# Patient Record
Sex: Male | Born: 1965 | Race: White | Hispanic: No | Marital: Single | Smoking: Never smoker
Health system: Southern US, Community
[De-identification: ages and names within clinical notes are randomized; demographics above are authoritative.]

## PROBLEM LIST (undated history)

## (undated) DIAGNOSIS — J45909 Unspecified asthma, uncomplicated: Secondary | ICD-10-CM

## (undated) DIAGNOSIS — Z9889 Other specified postprocedural states: Secondary | ICD-10-CM

## (undated) DIAGNOSIS — Z8719 Personal history of other diseases of the digestive system: Secondary | ICD-10-CM

## (undated) DIAGNOSIS — R112 Nausea with vomiting, unspecified: Secondary | ICD-10-CM

## (undated) HISTORY — PX: SHOULDER SURGERY: SHX246

## (undated) HISTORY — PX: KNEE ARTHROSCOPY: SUR90

## (undated) HISTORY — DX: Unspecified asthma, uncomplicated: J45.909

## (undated) HISTORY — PX: APPENDECTOMY: SHX54

## (undated) HISTORY — PX: BACK SURGERY: SHX140

---

## 2006-07-25 ENCOUNTER — Ambulatory Visit: Payer: Self-pay | Admitting: Pain Medicine

## 2006-09-07 ENCOUNTER — Ambulatory Visit: Payer: Self-pay | Admitting: Pain Medicine

## 2006-11-21 ENCOUNTER — Ambulatory Visit: Payer: Self-pay | Admitting: Pain Medicine

## 2007-01-10 ENCOUNTER — Ambulatory Visit: Payer: Self-pay | Admitting: Physician Assistant

## 2007-01-17 ENCOUNTER — Ambulatory Visit: Payer: Self-pay | Admitting: Pain Medicine

## 2007-01-23 ENCOUNTER — Ambulatory Visit: Payer: Self-pay | Admitting: Pain Medicine

## 2007-02-06 ENCOUNTER — Ambulatory Visit: Payer: Self-pay | Admitting: Pain Medicine

## 2007-04-04 ENCOUNTER — Ambulatory Visit: Payer: Self-pay | Admitting: Physician Assistant

## 2007-04-11 ENCOUNTER — Ambulatory Visit: Payer: Self-pay | Admitting: Pain Medicine

## 2007-04-24 ENCOUNTER — Ambulatory Visit: Payer: Self-pay | Admitting: Pain Medicine

## 2007-05-24 ENCOUNTER — Ambulatory Visit: Payer: Self-pay | Admitting: Physician Assistant

## 2007-06-26 ENCOUNTER — Ambulatory Visit: Payer: Self-pay | Admitting: Physician Assistant

## 2007-08-07 ENCOUNTER — Ambulatory Visit: Payer: Self-pay | Admitting: Pain Medicine

## 2007-10-12 ENCOUNTER — Encounter: Payer: Self-pay | Admitting: Pain Medicine

## 2007-10-25 ENCOUNTER — Ambulatory Visit: Payer: Self-pay | Admitting: Pain Medicine

## 2008-09-03 ENCOUNTER — Ambulatory Visit: Payer: Self-pay | Admitting: Pain Medicine

## 2016-02-04 ENCOUNTER — Encounter: Payer: Self-pay | Admitting: Pain Medicine

## 2016-02-04 ENCOUNTER — Ambulatory Visit: Payer: Worker's Compensation | Attending: Pain Medicine | Admitting: Pain Medicine

## 2016-02-04 VITALS — BP 135/79 | HR 68 | Temp 99.0°F | Resp 16 | Ht 70.0 in | Wt 197.0 lb

## 2016-02-04 DIAGNOSIS — Z0189 Encounter for other specified special examinations: Secondary | ICD-10-CM | POA: Diagnosis not present

## 2016-02-04 DIAGNOSIS — M79606 Pain in leg, unspecified: Secondary | ICD-10-CM | POA: Diagnosis present

## 2016-02-04 DIAGNOSIS — Z4542 Encounter for adjustment and management of neuropacemaker (brain) (peripheral nerve) (spinal cord): Secondary | ICD-10-CM

## 2016-02-04 DIAGNOSIS — Z462 Encounter for fitting and adjustment of other devices related to nervous system and special senses: Secondary | ICD-10-CM | POA: Diagnosis not present

## 2016-02-04 DIAGNOSIS — Z9689 Presence of other specified functional implants: Secondary | ICD-10-CM | POA: Diagnosis not present

## 2016-02-04 DIAGNOSIS — M549 Dorsalgia, unspecified: Secondary | ICD-10-CM | POA: Insufficient documentation

## 2016-02-04 DIAGNOSIS — J45909 Unspecified asthma, uncomplicated: Secondary | ICD-10-CM | POA: Insufficient documentation

## 2016-02-04 DIAGNOSIS — G8929 Other chronic pain: Secondary | ICD-10-CM | POA: Diagnosis not present

## 2016-02-04 NOTE — Progress Notes (Signed)
Patient's Name: Joseph Ware  Patient type: New patient  MRN: [id number]  Service setting: Ambulatory outpatient  DOB: 01-26-1966  Location: [facility]  DOS: 02/04/2016  Primary Care Physician: No primary care provider on file.  Note by: Edyth Glomb A. Dossie Arbour, M.D, DABA, DABAPM, DABPM, DABIPP, FIPP  Referring Physician: No ref. provider found  Specialty: Board-Certified Interventional Pain Management  Worker's Compensation case    Primary Reason(s) for Visit: Initial Patient Evaluation CC: Back Pain and Leg Pain   HPI  Joseph Ware is a 50 y.o. year old, male patient, who comes today for an initial evaluation. He has Spinal cord stimulator status; Battery end of life of spinal cord stimulator; Chronic pain; and Encounter for pain management planning on his problem list.. His primarily concern today is the Back Pain and Leg Pain   Pain Assessment: Self-Reported Pain Score: 5  Reported level of pain is compatible with clinical observations Pain Type: Chronic pain Pain Location: Back Pain Orientation: Left, Right Pain Descriptors / Indicators: Constant, Sharp Pain Frequency: Constant  Onset and Duration: Sudden, Started with work-related injury, Date of injury: 02/23/2005 and Present longer than 3 months Cause of pain: Worker's Compensation Injury. This was secondary to a work-related accident. Severity: No change since onset and NAS-11 now: 5/10 Timing: Not influenced by the time of the day Aggravating Factors: Bending, Lifiting, Prolonged sitting, Prolonged standing, Twisting and Walking Alleviating Factors: No alleviating factors except for spinal cord stimulator. Associated Problems: Numbness, Tingling and Pain that wakes patient up Quality of Pain: Constant, Exhausting, Nagging, Sharp, Shooting, Throbbing, Tingling and Tiring Previous Examinations or Tests: MRI scan, Nerve block, X-rays, Neurosurgical evaluation, Orthoperdic evaluation and Psychiatric  evaluation Previous Treatments: Spinal cord stimulator  The patient comes into the clinics today for the first time for a chronic pain management evaluation. I initially met the patient around 2008 at which time we implanted a spinal cord stimulator into the thoracolumbar region. The patient returns to the clinics today due to the fact that the battery on the device seems to now be dead. This means that the battery that he has implanted lasted 19 years. He would like to have it replaced.  The patient indicates his pain to be located in the center of the lower back radiating towards the left leg through the posterior aspect of the leg all the way down into the bottom of his left foot. This is usually well controlled with his spinal cord stimulator which he describes as a "blessing".   Historic Controlled Substance Pharmacotherapy Review  Currently Prescribed Analgesic: The patient is currently not using any type of pain medication. MME/day: 0 mg/day Pharmacodynamics: N/A Historical Background Evaluation: N/A Risk Assessment: Substance Use Disorder (SUD) Risk Level: Low Opioid Risk Tool (ORT) Score: Total Score: 0 Low Risk for SUD (Score <3) Depression Scale Score: PHQ-2: PHQ-2 Total Score: 0 No depression (0) PHQ-9: PHQ-9 Total Score: 0 No depression (0-4)  Pharmacologic Plan: No changes. We do not plan to start the patient on any pain medication at this time.  Meds  The patient has a current medication list which includes the following prescription(s): albuterol sulfate and ibuprofen.  ROS  Cardiovascular History: Negative for hypertension, coronary artery diseas, myocardial infraction, anticoagulant therapy or heart failure Pulmonary or Respiratory History: Asthma Neurological History: Negative for epilepsy, stroke, urinary or fecal inontinence, spina bifida or tethered cord syndrome Review of Past Neurological Studies: No results found for this or any previous  visit. Psychological-Psychiatric History: Negative for  anxiety, depression, schizophrenia, bipolar disorders or suicidal ideations or attempts Gastrointestinal History: Negative for peptic ulcer disease, hiatal hernia, GERD, IBS, hepatitis, cirrhosis or pancreatitis Genitourinary History: Negative for nephrolithiasis, hematuria, renal failure or chronic kidney disease Hematological History: Negative for anticoagulant therapy, anemia, bruising or bleeding easily, hemophilia, sickle cell disease or trait, thrombocytopenia or coagulupathies Endocrine History: Negative for diabetes or thyroid disease Rheumatologic History: Negative for lupus, osteoarthritis, rheumatoid arthritis, myositis, polymyositis or fibromyagia Musculoskeletal History: Negative for myasthenia gravis, muscular dystrophy, multiple sclerosis or malignant hyperthermia Work History: Legally disabled since 2006 according to the patient.  Allergies  Joseph Ware has No Known Allergies.  Tony  Medical:  Joseph Ware  has a past medical history of Asthma. Family: family history is not on file. Surgical:  has past surgical history that includes Shoulder surgery (Right). Tobacco:  reports that he has never smoked. He does not have any smokeless tobacco history on file. Alcohol:  reports that he drinks about 1.8 oz of alcohol per week. Drug:  reports that he does not use illicit drugs. Active Ambulatory Problems    Diagnosis Date Noted  . Spinal cord stimulator status 02/04/2016  . Battery end of life of spinal cord stimulator 02/04/2016  . Chronic pain 02/04/2016  . Encounter for pain management planning 02/04/2016   Resolved Ambulatory Problems    Diagnosis Date Noted  . No Resolved Ambulatory Problems   Past Medical History  Diagnosis Date  . Asthma     Constitutional Exam  Vitals: Blood pressure 135/79, pulse 68, temperature 99 F (37.2 C), temperature source Oral, resp. rate 16, height 5' 10"  (1.778 m), weight 197 lb  (89.359 kg), SpO2 99 %. General appearance: Well nourished, well developed, and well hydrated. In no acute distress Calculated BMI/Body habitus: Body mass index is 28.27 kg/(m^2). (25-29.9 kg/m2) Overweight - 20% higher incidence of chronic pain Psych/Mental status: Alert and oriented x 3 (person, place, & time) Eyes: PERLA Respiratory: No evidence of acute respiratory distress  Cervical Spine Exam  Inspection: No masses, redness, or swelling Alignment: Symmetrical ROM: Functional: Adequate ROM Active: Unrestricted ROM Stability: No instability detected Muscle strength & Tone: Functionally intact Sensory: Unimpaired Palpation: No complaints of tenderness  Upper Extremity (UE) Exam    Side: Right upper extremity  Side: Left upper extremity  Inspection: No masses, redness, swelling, or asymmetry  Inspection: No masses, redness, swelling, or asymmetry  ROM:  ROM:  Functional: Adequate ROM  Functional: Adequate ROM  Active: Unrestricted ROM  Active: Unrestricted ROM  Muscle strength & Tone: Functionally intact  Muscle strength & Tone: Functionally intact  Sensory: Unimpaired  Sensory: Unimpaired  Palpation: Non-contributory  Palpation: Non-contributory   Thoracic Spine Exam  Inspection: No masses, redness, or swelling Alignment: Symmetrical ROM: Functional: Adequate ROM Active: Unrestricted ROM Stability: No instability detected Sensory: Unimpaired Muscle strength & Tone: Functionally intact Palpation: No complaints of tenderness  Lumbar Spine Exam  Inspection: No masses, redness, or swelling. Well-healed scar. Alignment: Symmetrical ROM: Functional: Adequate ROM Active: Limited ROM Stability: No instability detected Muscle strength & Tone: Functionally intact Sensory: Unimpaired Palpation: Tender Provocative Tests: Lumbar Hyperextension and rotation test: deferred Patrick's Maneuver: deferred  Gait & Posture Assessment  Ambulation: Unassisted Gait:  Unaffected Posture: WNL  Lower Extremity Exam    Side: Right lower extremity  Side: Left lower extremity  Inspection: No masses, redness, swelling, or asymmetry ROM:  Inspection: No masses, redness, swelling, or asymmetry ROM:  Functional: Adequate ROM  Functional: Adequate ROM  Active: Unrestricted  ROM  Active: Unrestricted ROM  Muscle strength & Tone: Functionally intact  Muscle strength & Tone: Functionally intact  Sensory: Unimpaired  Sensory: Unimpaired  Palpation: Non-contributory  Palpation: Non-contributory   Assessment  Primary Diagnosis & Pertinent Problem List: The primary encounter diagnosis was Spinal cord stimulator status. Diagnoses of Battery end of life of spinal cord stimulator, Chronic pain, and Encounter for pain management planning were also pertinent to this visit.  Visit Diagnosis: 1. Spinal cord stimulator status   2. Battery end of life of spinal cord stimulator   3. Chronic pain   4. Encounter for pain management planning     Assessment: No problem-specific assessment & plan notes found for this encounter.   Plan of Care  Initial Treatment Plan:  Please be advised that as per protocol, today's visit has been an evaluation only. We have not taken over the patient's controlled substance management.  Problem List Items Addressed This Visit      High   Battery end of life of spinal cord stimulator   Chronic pain (Chronic)   Relevant Medications   ibuprofen (ADVIL,MOTRIN) 200 MG tablet   Other Relevant Orders   Comprehensive metabolic panel   C-reactive protein   Magnesium   Sedimentation rate   Vitamin B12   25-Hydroxyvitamin D Lcms D2+D3   CBC with Differential/Platelet   Encounter for pain management planning (Chronic)   Spinal cord stimulator status - Primary (Chronic)   Relevant Orders   DG Thoracic Spine 2 View   DG Lumbar Spine Complete      Pharmacotherapy (Medications Ordered): No orders of the defined types were placed in this  encounter.    Lab-work & Procedure Ordered: Orders Placed This Encounter  Procedures  . DG Thoracic Spine 2 View  . DG Lumbar Spine Complete  . Comprehensive metabolic panel  . C-reactive protein  . Magnesium  . Sedimentation rate  . Vitamin B12  . 25-Hydroxyvitamin D Lcms D2+D3  . CBC with Differential/Platelet    Imaging Ordered: DG THORACIC SPINE 2 VIEW DG LUMBAR SPINE COMPLETE 4 +V  Interventional Therapies: Scheduled:  None at this time.    Considering:  Spinal cord stimulator battery revision and replacement due to end of battery life.    PRN Procedures:  Spinal cord stimulator battery revision and replacement due to end of battery life.    Referral(s) or Consult(s): None at this time.  Medications administered during this visit: Mr. Kuang does not currently have medications on file.  Prescriptions ordered during this visit: New Prescriptions   No medications on file    Requested PM Follow-up: Return for Return after ordered test(s)..  No future appointments.   Primary Care Physician: No primary care provider on file. Location: [facility] Note by: Raylie Maddison A. Dossie Arbour, M.D, DABA, DABAPM, DABPM, DABIPP, FIPP  Pain Score Disclaimer: We use the NRS-11 scale. This is a self-reported, subjective measurement of pain severity with only modest accuracy. It is used primarily to identify changes within a particular patient. It must be understood that outpatient pain scales are significantly less accurate that those used for research, where they can be applied under ideal controlled circumstances with minimal exposure to variables. In reality, the score is likely to be a combination of pain intensity and pain affect, where pain affect describes the degree of emotional arousal or changes in action readiness caused by the sensory experience of pain. Factors such as social and work situation, setting, emotional state, anxiety levels,  expectation, and prior pain experience may influence pain perception and show large inter-individual differences that may also be affected by time variables.  Patient instructions provided during this appointment: There are no Patient Instructions on file for this visit.

## 2016-02-04 NOTE — Progress Notes (Signed)
Safety precautions to be maintained throughout the outpatient stay will include: orient to surroundings, keep bed in low position, maintain call bell within reach at all times, provide assistance with transfer out of bed and ambulation.  

## 2016-02-25 ENCOUNTER — Ambulatory Visit
Admission: RE | Admit: 2016-02-25 | Discharge: 2016-02-25 | Disposition: A | Discharge status: Home / Self Care | Payer: Worker's Compensation | Source: Ambulatory Visit | Attending: Pain Medicine | Admitting: Pain Medicine

## 2016-02-25 ENCOUNTER — Other Ambulatory Visit
Admission: RE | Admit: 2016-02-25 | Discharge: 2016-02-25 | Disposition: A | Discharge status: Home / Self Care | Payer: Worker's Compensation | Source: Ambulatory Visit | Attending: Pain Medicine | Admitting: Pain Medicine

## 2016-02-25 DIAGNOSIS — Z9689 Presence of other specified functional implants: Secondary | ICD-10-CM

## 2016-02-25 DIAGNOSIS — G8929 Other chronic pain: Secondary | ICD-10-CM | POA: Diagnosis present

## 2016-02-25 DIAGNOSIS — M47814 Spondylosis without myelopathy or radiculopathy, thoracic region: Secondary | ICD-10-CM | POA: Insufficient documentation

## 2016-02-25 DIAGNOSIS — M2578 Osteophyte, vertebrae: Secondary | ICD-10-CM | POA: Insufficient documentation

## 2016-02-25 DIAGNOSIS — M546 Pain in thoracic spine: Secondary | ICD-10-CM | POA: Diagnosis present

## 2016-02-25 LAB — CBC WITH DIFFERENTIAL/PLATELET
BASOS ABS: 0 10*3/uL (ref 0–0.1)
BASOS PCT: 0 %
EOS PCT: 4 %
Eosinophils Absolute: 0.2 10*3/uL (ref 0–0.7)
HEMATOCRIT: 45.4 % (ref 40.0–52.0)
Hemoglobin: 15.4 g/dL (ref 13.0–18.0)
LYMPHS PCT: 20 %
Lymphs Abs: 0.8 10*3/uL — ABNORMAL LOW (ref 1.0–3.6)
MCH: 32.6 pg (ref 26.0–34.0)
MCHC: 33.9 g/dL (ref 32.0–36.0)
MCV: 96.2 fL (ref 80.0–100.0)
MONO ABS: 0.3 10*3/uL (ref 0.2–1.0)
Monocytes Relative: 8 %
NEUTROS ABS: 2.9 10*3/uL (ref 1.4–6.5)
Neutrophils Relative %: 68 %
PLATELETS: 174 10*3/uL (ref 150–440)
RBC: 4.72 MIL/uL (ref 4.40–5.90)
RDW: 13 % (ref 11.5–14.5)
WBC: 4.3 10*3/uL (ref 3.8–10.6)

## 2016-02-25 LAB — COMPREHENSIVE METABOLIC PANEL
ALBUMIN: 4.8 g/dL (ref 3.5–5.0)
ALK PHOS: 74 U/L (ref 38–126)
ALT: 36 U/L (ref 17–63)
AST: 32 U/L (ref 15–41)
Anion gap: 6 (ref 5–15)
BILIRUBIN TOTAL: 0.8 mg/dL (ref 0.3–1.2)
BUN: 18 mg/dL (ref 6–20)
CALCIUM: 9.3 mg/dL (ref 8.9–10.3)
CO2: 25 mmol/L (ref 22–32)
Chloride: 107 mmol/L (ref 101–111)
Creatinine, Ser: 1.38 mg/dL — ABNORMAL HIGH (ref 0.61–1.24)
GFR calc Af Amer: >60 mL/min (ref >60)
GFR calc non Af Amer: 59 mL/min — ABNORMAL LOW (ref >60)
GLUCOSE: 106 mg/dL — AB (ref 65–99)
Potassium: 4.3 mmol/L (ref 3.5–5.1)
Sodium: 138 mmol/L (ref 135–145)
TOTAL PROTEIN: 7.2 g/dL (ref 6.5–8.1)

## 2016-02-25 LAB — VITAMIN B12: VITAMIN B 12: 235 pg/mL (ref 180–914)

## 2016-02-25 LAB — SEDIMENTATION RATE: SED RATE: 2 mm/h (ref 0–15)

## 2016-02-25 LAB — MAGNESIUM: Magnesium: 2.1 mg/dL (ref 1.7–2.4)

## 2016-02-26 LAB — C-REACTIVE PROTEIN

## 2016-02-26 NOTE — Progress Notes (Signed)
Quick Note:   Normal fasting (NPO x 8 hours) glucose levels are between 65-99 mg/dl, with 2 hour fasting, levels are usually less than 140 mg/dl. Any random blood glucose level greater than 200 mg/dl is considered to be Diabetes.  Normal Creatinine levels are between 0.5 and 0.9 mg/dl for our lab. Any condition that impairs the function of the kidneys is likely to raise the creatinine level in the blood. The most common causes of longstanding (chronic) kidney disease in adults are high blood pressure and diabetes. Other causes of elevated blood creatinine levels include drugs, ingestion of a large amount of dietary meat, kidney infections, rhabdomyolysis (abnormal muscle breakdown), and urinary tract obstruction.  eGFR (Estimated Glomerular Filtration Rate) results are reported as milliliters/minute/1.73m2 (mL/min/1.73m2). Because some laboratories do not collect information on a patient's race when the sample is collected for testing, they may report calculated results for both African Americans and non-African Americans.  The National Kid[employer] (NKF) suggests only reporting actual results once values are < 60 mL/min. 1. Normal values: 90-120 mL/min 2. Below 60 mL/min suggests that some kidney damage has occurred. 3. Between 59 and 30 indicate (Moderate) Stage 3 kidney disease. 4. Between 29 and 15 represent (Severe) Stage 4 kidney disease. 5. Less than 15 is considered (Kidney Failure) Stage 5. ______ 

## 2016-02-29 LAB — 25-HYDROXY VITAMIN D LCMS D2+D3
25-Hydroxy, Vitamin D-2: <1 ng/mL
25-Hydroxy, Vitamin D: 34 ng/mL

## 2016-02-29 LAB — 25-HYDROXYVITAMIN D LCMS D2+D3: 25-HYDROXY, VITAMIN D-3: 34 ng/mL

## 2016-04-07 ENCOUNTER — Ambulatory Visit: Payer: Worker's Compensation | Attending: Pain Medicine | Admitting: Pain Medicine

## 2016-04-07 ENCOUNTER — Encounter: Payer: Self-pay | Admitting: Pain Medicine

## 2016-04-07 ENCOUNTER — Encounter (INDEPENDENT_AMBULATORY_CARE_PROVIDER_SITE_OTHER): Payer: Self-pay

## 2016-04-07 VITALS — BP 126/88 | HR 70 | Temp 98.2°F | Resp 16 | Ht 70.0 in | Wt 200.0 lb

## 2016-04-07 DIAGNOSIS — M47816 Spondylosis without myelopathy or radiculopathy, lumbar region: Secondary | ICD-10-CM | POA: Diagnosis not present

## 2016-04-07 DIAGNOSIS — Z462 Encounter for fitting and adjustment of other devices related to nervous system and special senses: Secondary | ICD-10-CM

## 2016-04-07 DIAGNOSIS — Z4542 Encounter for adjustment and management of neuropacemaker (brain) (peripheral nerve) (spinal cord): Secondary | ICD-10-CM

## 2016-04-07 DIAGNOSIS — M549 Dorsalgia, unspecified: Secondary | ICD-10-CM | POA: Diagnosis present

## 2016-04-07 DIAGNOSIS — J45909 Unspecified asthma, uncomplicated: Secondary | ICD-10-CM | POA: Insufficient documentation

## 2016-04-07 DIAGNOSIS — M545 Low back pain: Secondary | ICD-10-CM | POA: Diagnosis not present

## 2016-04-07 DIAGNOSIS — Z9689 Presence of other specified functional implants: Secondary | ICD-10-CM

## 2016-04-07 DIAGNOSIS — M2578 Osteophyte, vertebrae: Secondary | ICD-10-CM | POA: Insufficient documentation

## 2016-04-07 DIAGNOSIS — G8929 Other chronic pain: Secondary | ICD-10-CM | POA: Diagnosis not present

## 2016-04-07 DIAGNOSIS — M79606 Pain in leg, unspecified: Secondary | ICD-10-CM | POA: Diagnosis present

## 2016-04-07 DIAGNOSIS — M25519 Pain in unspecified shoulder: Secondary | ICD-10-CM | POA: Insufficient documentation

## 2016-04-07 DIAGNOSIS — M5136 Other intervertebral disc degeneration, lumbar region: Secondary | ICD-10-CM | POA: Diagnosis not present

## 2016-04-07 NOTE — Progress Notes (Signed)
t's Name: Joseph Ware  Patient type: Established  MRN: [id number]  Service setting: Ambulatory outpatient  DOB: 01/11/1966  Location: [facility]  DOS: 04/07/2016  Primary Care Physician: No PCP Per Patient  Note by: Beatriz Chancellor A. Dossie Arbour, M.D, DABA, DABAPM, DABPM, DABIPP, FIPP  Referring Physician: No ref. provider found  Specialty: Board-Certified Interventional Pain Management  Last Visit to Pain Management: 02/04/2016   Primary Reason(s) for Visit: Encounter for Pre-op evaluation (Level of risk: moderate) CC: Shoulder Pain; Leg Pain; and Back Pain   HPI  Joseph Ware is a 50 y.o. year old, male patient, who returns today as an established patient. He has Spinal cord stimulator status; Battery end of life of spinal cord stimulator; Chronic pain; and Encounter for pain management planning on his problem list.. His primarily concern today is the Shoulder Pain; Leg Pain; and Back Pain   Pain Assessment: Self-Reported Pain Score: 7  Clinically the patient looks like a 2/10 Reported level is inconsistent with clinical obrservations Information on the proper use of the pain score provided to the patient today. Pain Type: Chronic pain Pain Location: Shoulder Pain Orientation: Left, Right Pain Descriptors / Indicators: Sharp Pain Frequency: Constant  The patient comes into the clinics today for pre-op evaluation prior to his spinal cord stimulator revision and replacement. The patient was met today by the [employer] representative and extensive information was provided to him with regards to the device and the options. The patient has an older device where the leads are not MRI compatible and because of this he would like to have some new leads implanted. He indicates that he may be needing some MRIs of his shoulders and he would like not to be limited by the implant. Patient informed of choices and wants complete replacement of generator and leads. The patient was also  informed today of the risks and possible complications associated with the procedure including, but not limited to: bleeding, infection, nerve damage, mechanical malfunction, and failure to provide relief of the pain.  Date of Last Visit: 02/04/16 Service Provided on Last Visit: Evaluation  Controlled Substance Pharmacotherapy Assessment & REMS (Risk Evaluation and Mitigation Strategy)  Currently Prescribed Analgesic: The patient is currently not using any type of pain medication. MME/day: 0 mg/day Pharmacodynamics: N/A Historical Background Evaluation: N/A Risk Assessment: Aberrant Behavior: None observed today Substance Use Disorder (SUD) Risk Level: Low Risk of opioid abuse or dependence: 0.7-3.0% with doses ? 36 MME/day and 6.1-26% with doses ? 120 MME/day. Opioid Risk Tool (ORT) Score: Total Score: 0 Low Risk for SUD (Score <3) Depression Scale Score: PHQ-2: PHQ-2 Total Score: 0 No depression (0) PHQ-9: PHQ-9 Total Score: 0 No depression (0-4)  Pharmacologic Plan: Today we may be taking over the patient's pharmacological regimen. See below  Previous Illicit Drug Screen Labs(s): No results found for: MDMA, COCAINSCRNUR, PCPSCRNUR, [facility]  Laboratory Chemistry  Inflammation Markers Lab Results  Component Value Date   ESRSEDRATE 2 02/25/2016   CRP <0.5 02/25/2016    Renal Function Lab Results  Component Value Date   BUN 18 02/25/2016   CREATININE 1.38* 02/25/2016   GFRAA >60 02/25/2016   GFRNONAA 59* 02/25/2016    Hepatic Function Lab Results  Component Value Date   AST 32 02/25/2016   ALT 36 02/25/2016   ALBUMIN 4.8 02/25/2016    Electrolytes Lab Results  Component Value Date   NA 138 02/25/2016   K 4.3 02/25/2016   CL 107 02/25/2016   CALCIUM 9.3 02/25/2016  MG 2.1 02/25/2016    Pain Modulating Vitamins Lab Results  Component Value Date   25OHVITD1 34 02/25/2016   25OHVITD2 <1.0 02/25/2016   25OHVITD3 34 02/25/2016   VITAMINB12 235 02/25/2016     Coagulation Parameters Lab Results  Component Value Date   PLT 174 02/25/2016    Cardiovascular Lab Results  Component Value Date   HGB 15.4 02/25/2016   HCT 45.4 02/25/2016   Note: Lab results reviewed.  Recent Diagnostic Imaging  Dg Thoracic Spine 2 View  02/25/2016  CLINICAL DATA:  Mid and lower back pain radiating to the left leg EXAM: THORACIC SPINE 2 VIEWS COMPARISON:  None. FINDINGS: There is no evidence of thoracic spine fracture. Alignment is normal. There are minimal degenerative joint changes of the lower thoracic spine with anterior osteophytosis. Spinal stimulator leads are identified. 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 Electronically Signed   By: Abelardo Diesel M.D.   On: 02/25/2016 16:00   Dg Lumbar Spine Complete  02/25/2016  CLINICAL DATA:  Mid and lower back pain extending to the left leg. EXAM: LUMBAR SPINE - COMPLETE 4+ VIEW COMPARISON:  None. FINDINGS: There is no evidence of lumbar spine fracture. Alignment is normal. Spinal stimulator leads are identified. There are minimal degenerative joint changes at L2-L3 and L4 with minimal anterior osteophytosis. There are degenerative joint changes of L5-S1 with narrowed joint space and osteophyte formation. 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 Electronically Signed   By: Abelardo Diesel M.D.   On: 02/25/2016 16:01   Thoracic Imaging: Thoracic DG 2-3 views:  Results for orders placed during the hospital encounter of 02/25/16  DG Thoracic Spine 2 View   Narrative CLINICAL DATA:  Mid and lower back pain radiating to the left leg  EXAM: THORACIC SPINE 2 VIEWS  COMPARISON:  None.  FINDINGS: There is no evidence of thoracic spine fracture. Alignment is normal. There are minimal degenerative joint changes of the lower thoracic spine with anterior osteophytosis. Spinal stimulator leads are identified.  IMPRESSION: Minimal  degenerative joint changes of the thoracic spine.   Electronically Signed   By: Abelardo Diesel M.D.   On: 02/25/2016 16:00    Lumbosacral Imaging: Lumbar DG (Complete) 4+V:  Results for orders placed during the hospital encounter of 02/25/16  DG Lumbar Spine Complete   Narrative CLINICAL DATA:  Mid and lower back pain extending to the left leg.  EXAM: LUMBAR SPINE - COMPLETE 4+ VIEW  COMPARISON:  None.  FINDINGS: There is no evidence of lumbar spine fracture. Alignment is normal. Spinal stimulator leads are identified. There are minimal degenerative joint changes at L2-L3 and L4 with minimal anterior osteophytosis. There are degenerative joint changes of L5-S1 with narrowed joint space and osteophyte formation.  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   Electronically Signed   By: Abelardo Diesel M.D.   On: 02/25/2016 16:01    Note: Imaging results reviewed.  Meds  The patient has a current medication list which includes the following prescription(s): albuterol sulfate and ibuprofen.  Current Outpatient Prescriptions on File Prior to Visit  Medication Sig  . ALBUTEROL SULFATE HFA IN Inhale into the lungs.  Marland Kitchen ibuprofen (ADVIL,MOTRIN) 200 MG tablet Take 200 mg by mouth every 6 (six) hours as needed.   No current facility-administered medications on file prior to visit.    ROS  Constitutional: Denies any fever or chills Gastrointestinal:  No reported hemesis, hematochezia, vomiting, or acute GI distress Musculoskeletal: Denies any acute onset joint swelling, redness, loss of ROM, or weakness Neurological: No reported episodes of acute onset apraxia, aphasia, dysarthria, agnosia, amnesia, paralysis, loss of coordination, or loss of consciousness  Allergies  Mr. Brozek has No Known Allergies.  [place]  Medical:  Mr. Cihlar  has a past medical history of Asthma. Family: family history is not on  file. Surgical:  has past surgical history that includes Shoulder surgery (Right). Tobacco:  reports that he has never smoked. He does not have any smokeless tobacco history on file. Alcohol:  reports that he drinks about 1.8 oz of alcohol per week. Drug:  reports that he does not use illicit drugs.  Constitutional Exam  Vitals: Blood pressure 126/88, pulse 70, temperature 98.2 F (36.8 C), temperature source Oral, resp. rate 16, height 5' 10" (1.778 m), weight 200 lb (90.719 kg), SpO2 99 %. General appearance: Well nourished, well developed, and well hydrated. In no acute distress Calculated BMI/Body habitus: Body mass index is 28.7 kg/(m^2). (25-29.9 kg/m2) Overweight - 20% higher incidence of chronic pain Psych/Mental status: Alert and oriented x 3 (person, place, & time) Eyes: PERLA Respiratory: No evidence of acute respiratory distress  Cervical Spine Exam  Inspection: No masses, redness, or swelling Alignment: Symmetrical ROM: Functional: ROM is within functional limits [facility]) Stability: No instability detected Muscle strength & Tone: Functionally intact Sensory: Unimpaired Palpation: No complaints of tenderness  Upper Extremity (UE) Exam    Side: Right upper extremity  Side: Left upper extremity  Inspection: No masses, redness, swelling, or asymmetry  Inspection: No masses, redness, swelling, or asymmetry  ROM:  ROM:  Functional: ROM is within functional limits [facility])        Functional: ROM is within functional limits [facility])        Muscle strength & Tone: Functionally intact  Muscle strength & Tone: Functionally intact  Sensory: Unimpaired  Sensory: Unimpaired  Palpation: No complaints of tenderness  Palpation: No complaints of tenderness   Thoracic Spine Exam  Inspection: No masses, redness, or swelling Alignment: Symmetrical ROM: Functional: ROM is within functional limits [facility]) Stability: No instability detected Sensory: Unimpaired Muscle strength & Tone: Functionally  intact Palpation: No complaints of tenderness  Lumbar Spine Exam  Inspection: Well healed scar from previous spine surgery detected Alignment: Symmetrical ROM: Functional: ROM is within functional limits [facility]) Stability: No instability detected Muscle strength & Tone: Functionally intact Sensory: Unimpaired Palpation: No complaints of tenderness Provocative Tests: Lumbar Hyperextension and rotation test: evaluation deferred today       Patrick's Maneuver: evaluation deferred today              Gait & Posture Assessment  Ambulation: Unassisted Gait: Unaffected Posture: WNL  Lower Extremity Exam    Side: Right lower extremity  Side: Left lower extremity  Inspection: No masses, redness, swelling, or asymmetry ROM:  Inspection: No masses, redness, swelling, or asymmetry ROM:  Functional: ROM is within functional limits [facility])        Functional: ROM is within functional limits [facility])        Muscle strength & Tone: Functionally intact  Muscle strength & Tone: Functionally intact  Sensory: Unimpaired  Sensory: Unimpaired  Palpation: No complaints of tenderness  Palpation: No complaints of tenderness}   Assessment & Plan  Primary Diagnosis & Pertinent Problem List: There were no encounter diagnoses.  Visit Diagnosis: No diagnosis found.  Problems updated and reviewed during this visit: No problems  updated.  Problem-specific Plan(s): No problem-specific assessment & plan notes found for this encounter.  No new assessment & plan notes have been filed under this hospital service since the last note was generated. Service: Pain Management   Plan of Care   Problem List Items Addressed This Visit    None       Pharmacotherapy (Medications Ordered): No orders of the defined types were placed in this encounter.    Lab-work & Procedure Ordered: No orders of the defined types were placed in this encounter.    Imaging Ordered: None  Interventional Therapies: Scheduled:   Complete SCS system replacement. Currently tripolar system going to dual lead system (16 electrodes).    Considering:  SCS octad leads model [id number] (x2) + Surescan sensor model [id number].   PRN Procedures:  N/A   Referral(s) or Consult(s): None at this time.  New Prescriptions   No medications on file    Medications administered during this visit: Mr. Kissner does not currently have medications on file.  Requested PM Follow-up: Return for Schedule Procedure, (ASAP).  No future appointments.  Primary Care Physician: No PCP Per Patient Location: [facility] Note by: Sesar Madewell A. Dossie Arbour, M.D, DABA, DABAPM, DABPM, DABIPP, FIPP  Pain Score Disclaimer: We use the NRS-11 scale. This is a self-reported, subjective measurement of pain severity with only modest accuracy. It is used primarily to identify changes within a particular patient. It must be understood that outpatient pain scales are significantly less accurate that those used for research, where they can be applied under ideal controlled circumstances with minimal exposure to variables. In reality, the score is likely to be a combination of pain intensity and pain affect, where pain affect describes the degree of emotional arousal or changes in action readiness caused by the sensory experience of pain. Factors such as social and work situation, setting, emotional state, anxiety levels, expectation, and prior pain experience may influence pain perception and show large inter-individual differences that may also be affected by time variables.  Patient instructions provided during this appointment: There are no Patient Instructions on file for this visit.

## 2016-04-07 NOTE — Progress Notes (Signed)
Safety precautions to be maintained throughout the outpatient stay will include: orient to surroundings, keep bed in low position, maintain call bell within reach at all times, provide assistance with transfer out of bed and ambulation.  

## 2016-05-12 ENCOUNTER — Other Ambulatory Visit: Payer: Self-pay

## 2016-05-31 ENCOUNTER — Ambulatory Visit: Payer: Worker's Compensation | Attending: Pain Medicine | Admitting: Pain Medicine

## 2016-05-31 ENCOUNTER — Encounter: Payer: Self-pay | Admitting: Pain Medicine

## 2016-05-31 ENCOUNTER — Other Ambulatory Visit: Payer: Self-pay | Admitting: Pain Medicine

## 2016-05-31 ENCOUNTER — Encounter
Admission: RE | Admit: 2016-05-31 | Discharge: 2016-05-31 | Disposition: A | Discharge status: Home / Self Care | Payer: Self-pay | Source: Ambulatory Visit | Attending: Pain Medicine | Admitting: Pain Medicine

## 2016-05-31 VITALS — BP 136/92 | HR 56 | Temp 98.3°F | Resp 16 | Ht 70.0 in | Wt 195.0 lb

## 2016-05-31 DIAGNOSIS — M2578 Osteophyte, vertebrae: Secondary | ICD-10-CM | POA: Insufficient documentation

## 2016-05-31 DIAGNOSIS — Z4542 Encounter for adjustment and management of neuropacemaker (brain) (peripheral nerve) (spinal cord): Secondary | ICD-10-CM

## 2016-05-31 DIAGNOSIS — Z9689 Presence of other specified functional implants: Secondary | ICD-10-CM | POA: Insufficient documentation

## 2016-05-31 DIAGNOSIS — M47816 Spondylosis without myelopathy or radiculopathy, lumbar region: Secondary | ICD-10-CM | POA: Diagnosis not present

## 2016-05-31 DIAGNOSIS — M5134 Other intervertebral disc degeneration, thoracic region: Secondary | ICD-10-CM | POA: Diagnosis not present

## 2016-05-31 DIAGNOSIS — Z462 Encounter for fitting and adjustment of other devices related to nervous system and special senses: Secondary | ICD-10-CM | POA: Insufficient documentation

## 2016-05-31 DIAGNOSIS — G8929 Other chronic pain: Secondary | ICD-10-CM | POA: Insufficient documentation

## 2016-05-31 DIAGNOSIS — M79606 Pain in leg, unspecified: Secondary | ICD-10-CM | POA: Diagnosis not present

## 2016-05-31 DIAGNOSIS — M545 Low back pain: Secondary | ICD-10-CM | POA: Diagnosis present

## 2016-05-31 DIAGNOSIS — J45909 Unspecified asthma, uncomplicated: Secondary | ICD-10-CM | POA: Diagnosis not present

## 2016-05-31 HISTORY — DX: Other specified postprocedural states: Z98.890

## 2016-05-31 HISTORY — DX: Nausea with vomiting, unspecified: R11.2

## 2016-05-31 HISTORY — DX: Personal history of other diseases of the digestive system: Z87.19

## 2016-05-31 NOTE — Progress Notes (Signed)
Patient's Name: Joseph Ware  MRN: [id number]  Referring Provider: No ref. provider found  DOB: 19-Nov-1965  PCP: No PCP Per Patient  DOS: 05/31/2016  Note by: Joseph LevansFrancisco A. Laban EmperorNaveira, MD  Service setting: Ambulatory outpatient  Specialty: Interventional Pain Management  Location: [facility] (AMB) Pain Management Facility    Patient type: Established   Primary Reason(s) for Visit: Encounter for evaluation before Surgery (Level of risk: moderate) CC: Back Pain (low)  HPI  Mr. Joseph FillersHaymore is a 50 y.o. year old, male patient, who returns today as an established patient. He has Spinal cord stimulator status; Battery end of life of spinal cord stimulator; Chronic pain; Encounter for pain management planning; Encounter for interrogation of neurostimulator; and Chronic pain of lower extremity on his problem list.. His primarily concern today is the Back Pain (low)  Pain Assessment: Self-Reported Pain Score: 6  Clinically the patient looks like a 1/10 Reported level is inconsistent with clinical observations. Information on the proper use of the pain score provided to the patient today. Pain Type: Chronic pain Pain Location: Back Pain Orientation: Lower Pain Descriptors / Indicators: Sharp Pain Frequency: Constant  The patient comes into the clinics today for post-procedure evaluation on the interventional treatment done on 04/07/2016. In addition, he comes in today for pharmacological management of his chronic pain.  The patient  reports that he does not use drugs. For years the patient has enjoyed good relief of his pain from the spinal cord stimulator. Unfortunately, the battery has ran out and he needs to come in for a replacement. He is generally healthy and therefore the risk of this procedure should not be significant, based on his general medical health. However, he has been informed of the usual risks and possible complications including but not limited to bleeding, infection, nerve damage, mechanical  problems with the device, or some type of unexpected, unpredictable problem.  Date of Last Visit: 04/07/16 Service Provided on Last Visit: Evaluation  Controlled Substance Pharmacotherapy Assessment & REMS (Risk Evaluation and Mitigation Strategy)  Analgesic: He is currently not taking any opioids.  Risk Assessment: Aberrant Behavior: None observed today Substance Use Disorder (SUD) Risk Level: Low Risk of opioid abuse or dependence: 0.7-3.0% with doses ? 36 MME/day and 6.1-26% with doses ? 120 MME/day.  Depression Scale Score: PHQ-2: PHQ-2 Total Score: 0 No depression (0) PHQ-9: PHQ-9 Total Score: 0 No depression (0-4)  Pharmacologic Plan: Today we may be taking over the patient's pharmacological regimen. See below  Previous Illicit Drug Screen Labs(s): No results found for: MDMA, COCAINSCRNUR, PCPSCRNUR, [facility]  Laboratory Chemistry  Inflammation Markers Lab Results  Component Value Date   ESRSEDRATE 2 02/25/2016   CRP <0.5 02/25/2016    Renal Function Lab Results  Component Value Date   BUN 18 02/25/2016   CREATININE 1.38 (H) 02/25/2016   GFRAA >60 02/25/2016   GFRNONAA 59 (L) 02/25/2016    Hepatic Function Lab Results  Component Value Date   AST 32 02/25/2016   ALT 36 02/25/2016   ALBUMIN 4.8 02/25/2016    Electrolytes Lab Results  Component Value Date   NA 138 02/25/2016   K 4.3 02/25/2016   CL 107 02/25/2016   CALCIUM 9.3 02/25/2016   MG 2.1 02/25/2016    Pain Modulating Vitamins Lab Results  Component Value Date   25OHVITD1 34 02/25/2016   25OHVITD2 <1.0 02/25/2016   25OHVITD3 34 02/25/2016   VITAMINB12 235 02/25/2016    Coagulation Parameters Lab Results  Component Value Date   PLT  174 02/25/2016    Cardiovascular Lab Results  Component Value Date   HGB 15.4 02/25/2016   HCT 45.4 02/25/2016   Note: Lab results reviewed.  Recent Diagnostic Imaging  Thoracic Imaging: Thoracic DG 2-3 views:  Results for orders placed during the  hospital encounter of 02/25/16  DG Thoracic Spine 2 View   Narrative CLINICAL DATA:  Mid and lower back pain radiating to the left leg  EXAM: THORACIC SPINE 2 VIEWS  COMPARISON:  None.  FINDINGS: There is no evidence of thoracic spine fracture. Alignment is normal. There are minimal degenerative joint changes of the lower thoracic spine with anterior osteophytosis. Spinal stimulator leads are identified.  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   Electronically Signed   By: Sherian Rein M.D.   On: 02/25/2016 16:00    Lumbosacral Imaging: Lumbar DG (Complete) 4+V:  Results for orders placed during the hospital encounter of 02/25/16  DG Lumbar Spine Complete   Narrative CLINICAL DATA:  Mid and lower back pain extending to the left leg.  EXAM: LUMBAR SPINE - COMPLETE 4+ VIEW  COMPARISON:  None.  FINDINGS: There is no evidence of lumbar spine fracture. Alignment is normal. Spinal stimulator leads are identified. There are minimal degenerative joint changes at L2-L3 and L4 with minimal anterior osteophytosis. There are degenerative joint changes of L5-S1 with narrowed joint space and osteophyte formation.  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   Electronically Signed   By: Sherian Rein M.D.   On: 02/25/2016 16:01    Note: Imaging results reviewed.  Meds  The patient has a current medication list which includes the following prescription(s): albuterol sulfate, ephedrine-guaifenesin, ibuprofen, and ranitidine.  Current Outpatient Prescriptions on File Prior to Visit  Medication Sig  . ALBUTEROL SULFATE HFA IN Inhale into the lungs.  Marland Kitchen ePHEDrine-GuaiFENesin (PRIMATENE ASTHMA PO) Take by mouth as needed.  Marland Kitchen ibuprofen (ADVIL,MOTRIN) 200 MG tablet Take 200 mg by mouth every 6 (six) hours as needed.  . ranitidine (ZANTAC) 150 MG tablet Take 150 mg by mouth as needed for  heartburn.   No current facility-administered medications on file prior to visit.     ROS  Constitutional: Denies any fever or chills Gastrointestinal: No reported hemesis, hematochezia, vomiting, or acute GI distress Musculoskeletal: Denies any acute onset joint swelling, redness, loss of ROM, or weakness Neurological: No reported episodes of acute onset apraxia, aphasia, dysarthria, agnosia, amnesia, paralysis, loss of coordination, or loss of consciousness  Allergies  Joseph Ware has No Known Allergies.  PFSH  Medical:  Joseph Ware  has a past medical history of Asthma; History of hiatal hernia; and PONV (postoperative nausea and vomiting). Family: family history is not on file. Surgical:  has a past surgical history that includes Shoulder surgery (Right); Back surgery; Knee arthroscopy (Bilateral); and Appendectomy. Tobacco:  reports that he has never smoked. He has never used smokeless tobacco. Alcohol:  reports that he drinks about 1.8 oz of alcohol per week . Drug:  reports that he does not use drugs.  Constitutional Exam  General appearance: Well nourished, well developed, and well hydrated. In no acute distress Vitals:   05/31/16 0842  BP: (!) 136/92  Pulse: (!) 56  Resp: 16  Temp: 98.3 F (36.8 C)  SpO2: 99%  Weight: 195 lb (88.5 kg)  Height: 5\' 10"  (1.778 m)  BMI Assessment: Estimated body mass index is 27.98 kg/m as calculated from the following:  Height as of this encounter: 5\' 10"  (1.778 m).   Weight as of this encounter: 195 lb (88.5 kg).   BMI interpretation:           BMI Readings from Last 4 Encounters:  05/31/16 27.98 kg/m  05/31/16 27.98 kg/m  04/07/16 28.70 kg/m  02/04/16 28.27 kg/m   Wt Readings from Last 4 Encounters:  05/31/16 195 lb (88.5 kg)  05/31/16 195 lb (88.5 kg)  04/07/16 200 lb (90.7 kg)  02/04/16 197 lb (89.4 kg)  Psych/Mental status: Alert and oriented x 3 (person, place, & time) Eyes: PERLA Respiratory: No evidence of acute  respiratory distress  Cervical Spine Exam  Inspection: No masses, redness, or swelling Alignment: Symmetrical Functional ROM: ROM appears unrestricted Stability: No instability detected Muscle strength & Tone: Functionally intact Sensory: Unimpaired Palpation: Non-contributory  Upper Extremity (UE) Exam    Side: Right upper extremity  Side: Left upper extremity  Inspection: No masses, redness, swelling, or asymmetry  Inspection: No masses, redness, swelling, or asymmetry  Functional ROM: ROM appears unrestricted          Functional ROM: ROM appears unrestricted          Muscle strength & Tone: Functionally intact  Muscle strength & Tone: Functionally intact  Sensory: Unimpaired  Sensory: Unimpaired  Palpation: Non-contributory  Palpation: Non-contributory   Thoracic Spine Exam  Inspection: No masses, redness, or swelling Alignment: Symmetrical Functional ROM: ROM appears unrestricted Stability: No instability detected Sensory: Unimpaired Muscle strength & Tone: Functionally intact Palpation: Non-contributory  Lumbar Spine Exam  Inspection: No masses, redness, or swelling Alignment: Symmetrical Functional ROM: ROM appears unrestricted Stability: No instability detected Muscle strength & Tone: Functionally intact Sensory: Unimpaired Palpation: Non-contributory Provocative Tests: Lumbar Hyperextension and rotation test: evaluation deferred today       Patrick's Maneuver: evaluation deferred today              Gait & Posture Assessment  Ambulation: Unassisted Gait: Relatively normal for age and body habitus Posture: WNL   Lower Extremity Exam    Side: Right lower extremity  Side: Left lower extremity  Inspection: No masses, redness, swelling, or asymmetry  Inspection: No masses, redness, swelling, or asymmetry  Functional ROM: ROM appears unrestricted          Functional ROM: ROM appears unrestricted          Muscle strength & Tone: Functionally intact  Muscle strength &  Tone: Functionally intact  Sensory: Unimpaired  Sensory: Unimpaired  Palpation: Non-contributory  Palpation: Non-contributory    Assessment & Plan  Primary Diagnosis & Pertinent Problem List: The primary encounter diagnosis was Battery end of life of spinal cord stimulator. Diagnoses of Chronic pain, Spinal cord stimulator status, Encounter for interrogation of neurostimulator, and Chronic pain of lower extremity, unspecified laterality were also pertinent to this visit.  Visit Diagnosis: 1. Battery end of life of spinal cord stimulator   2. Chronic pain   3. Spinal cord stimulator status   4. Encounter for interrogation of neurostimulator   5. Chronic pain of lower extremity, unspecified laterality     Problems updated and reviewed during this visit: Problem  Encounter for Interrogation of Neurostimulator  Chronic Pain of Lower Extremity    Problem-specific Plan(s): No problem-specific Assessment & Plan notes found for this encounter.  No new Assessment & Plan notes have been filed under this hospital service since the last note was generated. Service: Pain Management   Plan of Care   Problem List Items  Addressed This Visit      High   Battery end of life of spinal cord stimulator - Primary   Chronic pain (Chronic)   Chronic pain of lower extremity (Chronic)   Encounter for interrogation of neurostimulator   Spinal cord stimulator status (Chronic)    Other Visit Diagnoses   None.      Pharmacotherapy (Medications Ordered): No orders of the defined types were placed in this encounter.   Lab-work & Procedure Ordered: No orders of the defined types were placed in this encounter.   Imaging Ordered: OT NEUROSTIMULATOR  Interventional Therapies: Scheduled:  Revision and replacement of neurostimulator battery.    Considering:  None at this time.    PRN Procedures:  None at this time.    Referral(s) or Consult(s): None at this time.  New Prescriptions   No  medications on file    Medications administered during this visit: Joseph Ware had no medications administered during this visit.  Requested PM Follow-up: Return in 3 weeks (on 06/21/2016) for Post-Procedure evaluation.  Future Appointments Date Time Provider Department Center  05/31/2016 11:40 AM Delano Metz, MD [facility] None  06/21/2016 11:40 AM Delano Metz, MD [facility] None    Primary Care Physician: No PCP Per Patient Location: [facility] Note by: Joseph Levans. Laban Emperor, M.D, DABA, DABAPM, DABPM, DABIPP, FIPP  Pain Score Disclaimer: We use the NRS-11 scale. This is a self-reported, subjective measurement of pain severity with only modest accuracy. It is used primarily to identify changes within a particular patient. It must be understood that outpatient pain scales are significantly less accurate that those used for research, where they can be applied under ideal controlled circumstances with minimal exposure to variables. In reality, the score is likely to be a combination of pain intensity and pain affect, where pain affect describes the degree of emotional arousal or changes in action readiness caused by the sensory experience of pain. Factors such as social and work situation, setting, emotional state, anxiety levels, expectation, and prior pain experience may influence pain perception and show large inter-individual differences that may also be affected by time variables.  Patient instructions provided during this appointment: There are no Patient Instructions on file for this visit.

## 2016-05-31 NOTE — Patient Instructions (Signed)
Your procedure is scheduled on: Friday 06/11/16 Report to Day Surgery. 2ND FLOOR MEDICAL MALL ENTRANCE To find out your arrival time please call [phone] between 1PM - 3PM on Thursday 06/10/16.  Remember: Instructions that are not followed completely may result in serious medical risk, up to and including death, or upon the discretion of your surgeon and anesthesiologist your surgery may need to be rescheduled.    __X__ 1. Do not eat food or drink liquids after midnight. No gum chewing or hard candies.     __X__ 2. No Alcohol for 24 hours before or after surgery.   ____ 3. Bring all medications with you on the day of surgery if instructed.    __X__ 4. Notify your doctor if there is any change in your medical condition     (cold, fever, infections).     Do not wear jewelry, make-up, hairpins, clips or nail polish.  Do not wear lotions, powders, or perfumes.   Do not shave 48 hours prior to surgery. Men may shave face and neck.  Do not bring valuables to the hospital.    [facility] is not responsible for any belongings or valuables.               Contacts, dentures or bridgework may not be worn into surgery.  Leave your suitcase in the car. After surgery it may be brought to your room.  For patients admitted to the hospital, discharge time is determined by your                treatment team.   Patients discharged the day of surgery will not be allowed to drive home.   Please read over the following fact sheets that you were given:   Surgical Site Infection Prevention   ____ Take these medicines the morning of surgery with A SIP OF WATER:    1. NONE  2.   3.   4.  5.  6.  ____ Fleet Enema (as directed)   ____ Use CHG Soap as directed  __X__ Use inhalers on the day of surgery  ____ Stop metformin 2 days prior to surgery    ____ Take 1/2 of usual insulin dose the night before surgery and none on the morning of surgery.   ____ Stop Coumadin/Plavix/aspirin on   __X__  Stop Anti-inflammatories on 06/04/16 ONE WEEK PRIOR  (IBUPROFEN) MAY TAKE TYLENOL IF ALLOWED   ____ Stop supplements until after surgery.    ____ Bring C-Pap to the hospital.

## 2016-06-10 MED ORDER — CEFAZOLIN IN D5W 1 GM/50ML IV SOLN
1.0000 g | INTRAVENOUS | Status: AC
  Administered 2016-06-11: 1 g via INTRAVENOUS

## 2016-06-11 ENCOUNTER — Ambulatory Visit: Payer: Worker's Compensation | Admitting: Anesthesiology

## 2016-06-11 ENCOUNTER — Ambulatory Visit
Admission: RE | Admit: 2016-06-11 | Discharge: 2016-06-11 | Disposition: A | Discharge status: Home / Self Care | Payer: Worker's Compensation | Source: Ambulatory Visit | Attending: Pain Medicine | Admitting: Pain Medicine

## 2016-06-11 ENCOUNTER — Encounter: Payer: Self-pay | Admitting: *Deleted

## 2016-06-11 ENCOUNTER — Encounter
Admission: RE | Disposition: A | Discharge status: Home / Self Care | Payer: Self-pay | Source: Ambulatory Visit | Attending: Pain Medicine

## 2016-06-11 DIAGNOSIS — Z4542 Encounter for adjustment and management of neuropacemaker (brain) (peripheral nerve) (spinal cord): Secondary | ICD-10-CM | POA: Diagnosis present

## 2016-06-11 DIAGNOSIS — K219 Gastro-esophageal reflux disease without esophagitis: Secondary | ICD-10-CM | POA: Diagnosis not present

## 2016-06-11 DIAGNOSIS — Z462 Encounter for fitting and adjustment of other devices related to nervous system and special senses: Secondary | ICD-10-CM

## 2016-06-11 DIAGNOSIS — G8929 Other chronic pain: Secondary | ICD-10-CM | POA: Insufficient documentation

## 2016-06-11 DIAGNOSIS — J45909 Unspecified asthma, uncomplicated: Secondary | ICD-10-CM | POA: Insufficient documentation

## 2016-06-11 DIAGNOSIS — M5417 Radiculopathy, lumbosacral region: Secondary | ICD-10-CM | POA: Insufficient documentation

## 2016-06-11 HISTORY — PX: SPINAL CORD STIMULATOR BATTERY EXCHANGE: SHX6202

## 2016-06-11 SURGERY — SPINAL CORD STIMULATOR BATTERY EXCHANGE
Anesthesia: General | Site: Back | Laterality: Right | Wound class: Clean

## 2016-06-11 MED ORDER — BUPIVACAINE HCL (PF) 0.5 % IJ SOLN
INTRAMUSCULAR | Status: AC
  Filled 2016-06-11: qty 30

## 2016-06-11 MED ORDER — CEFAZOLIN IN D5W 1 GM/50ML IV SOLN
INTRAVENOUS | Status: AC
  Filled 2016-06-11: qty 50

## 2016-06-11 MED ORDER — PROPOFOL 500 MG/50ML IV EMUL
INTRAVENOUS | Status: DC | PRN
  Administered 2016-06-11: 50 ug/kg/min via INTRAVENOUS

## 2016-06-11 MED ORDER — DEXAMETHASONE SODIUM PHOSPHATE 10 MG/ML IJ SOLN
INTRAMUSCULAR | Status: DC | PRN
  Administered 2016-06-11: 10 mg via INTRAVENOUS

## 2016-06-11 MED ORDER — SCOPOLAMINE 1 MG/3DAYS TD PT72
MEDICATED_PATCH | TRANSDERMAL | Status: AC
  Administered 2016-06-11: 1.5 mg via TRANSDERMAL
  Filled 2016-06-11: qty 1

## 2016-06-11 MED ORDER — SCOPOLAMINE 1 MG/3DAYS TD PT72
1.0000 | MEDICATED_PATCH | Freq: Once | TRANSDERMAL | Status: DC
  Administered 2016-06-11: 1.5 mg via TRANSDERMAL

## 2016-06-11 MED ORDER — ONDANSETRON HCL 4 MG/2ML IJ SOLN: 4.0000 mg | Freq: Once | INTRAMUSCULAR | Status: DC | PRN

## 2016-06-11 MED ORDER — FAMOTIDINE 20 MG PO TABS: 20.0000 mg | ORAL_TABLET | Freq: Once | ORAL | Status: DC

## 2016-06-11 MED ORDER — LIDOCAINE HCL (PF) 1 % IJ SOLN
INTRAMUSCULAR | Status: AC
  Filled 2016-06-11: qty 30

## 2016-06-11 MED ORDER — EPHEDRINE SULFATE 50 MG/ML IJ SOLN
INTRAMUSCULAR | Status: DC | PRN
  Administered 2016-06-11: 10 mg via INTRAVENOUS
  Administered 2016-06-11: 5 mg via INTRAVENOUS
  Administered 2016-06-11: 10 mg via INTRAVENOUS

## 2016-06-11 MED ORDER — LIDOCAINE HCL (CARDIAC) 20 MG/ML IV SOLN
INTRAVENOUS | Status: DC | PRN
  Administered 2016-06-11: 100 mg via INTRAVENOUS

## 2016-06-11 MED ORDER — PHENYLEPHRINE HCL 10 MG/ML IJ SOLN
INTRAMUSCULAR | Status: DC | PRN
  Administered 2016-06-11: 100 ug via INTRAVENOUS

## 2016-06-11 MED ORDER — FAMOTIDINE 20 MG PO TABS
ORAL_TABLET | ORAL | Status: AC
  Filled 2016-06-11: qty 1

## 2016-06-11 MED ORDER — HYDROCODONE-ACETAMINOPHEN 5-325 MG PO TABS: 1.0000 | ORAL_TABLET | Freq: Four times a day (QID) | ORAL | 0 refills | Status: DC | PRN

## 2016-06-11 MED ORDER — CEPHALEXIN 500 MG PO CAPS: 500.0000 mg | ORAL_CAPSULE | Freq: Three times a day (TID) | ORAL | 0 refills | Status: DC

## 2016-06-11 MED ORDER — LIDOCAINE HCL (PF) 1 % IJ SOLN
INTRAMUSCULAR | Status: DC | PRN
  Administered 2016-06-11: 16 mL via INTRAMUSCULAR

## 2016-06-11 MED ORDER — PROPOFOL 10 MG/ML IV BOLUS
INTRAVENOUS | Status: DC | PRN
  Administered 2016-06-11: 40 mg via INTRAVENOUS
  Administered 2016-06-11: 60 mg via INTRAVENOUS

## 2016-06-11 MED ORDER — FENTANYL CITRATE (PF) 100 MCG/2ML IJ SOLN: 25.0000 ug | INTRAMUSCULAR | Status: DC | PRN

## 2016-06-11 MED ORDER — SODIUM CHLORIDE 0.9 % IJ SOLN
INTRAMUSCULAR | Status: AC
  Filled 2016-06-11: qty 50

## 2016-06-11 MED ORDER — ONDANSETRON HCL 4 MG/2ML IJ SOLN
INTRAMUSCULAR | Status: DC | PRN
  Administered 2016-06-11: 4 mg via INTRAVENOUS

## 2016-06-11 MED ORDER — MIDAZOLAM HCL 2 MG/2ML IJ SOLN
INTRAMUSCULAR | Status: DC | PRN
  Administered 2016-06-11: 2 mg via INTRAVENOUS

## 2016-06-11 MED ORDER — LACTATED RINGERS IV SOLN
INTRAVENOUS | Status: DC
  Administered 2016-06-11: 07:00:00 via INTRAVENOUS

## 2016-06-11 SURGICAL SUPPLY — 40 items
BLADE SURG 15 STRL LF DISP TIS (BLADE) ×1 IMPLANT
BLADE SURG 15 STRL SS (BLADE) ×2
CLOSURE WOUND 1/2 X4 (GAUZE/BANDAGES/DRESSINGS) ×1
DRAPE C-ARM XRAY 36X54 (DRAPES) IMPLANT
DRAPE CAMERA VIDEO/LASER (DRAPES) ×3 IMPLANT
DRAPE INCISE IOBAN 66X45 STRL (DRAPES) ×3 IMPLANT
DRESSING TELFA 4X3 1S ST N-ADH (GAUZE/BANDAGES/DRESSINGS) ×3 IMPLANT
DRSG TEGADERM 4X4.75 (GAUZE/BANDAGES/DRESSINGS) ×9 IMPLANT
DRSG TELFA 3X8 NADH (GAUZE/BANDAGES/DRESSINGS) ×6 IMPLANT
DURAPREP 26ML APPLICATOR (WOUND CARE) ×3 IMPLANT
ELECT REM PT RETURN 9FT ADLT (ELECTROSURGICAL) ×3
ELECTRODE REM PT RTRN 9FT ADLT (ELECTROSURGICAL) ×1 IMPLANT
GLOVE BIO SURGEON STRL SZ8 (GLOVE) ×3 IMPLANT
GLOVE EXAM NITRILE PF MED BLUE (GLOVE) ×3 IMPLANT
GLOVE SURG XRAY 8.5 LX (GLOVE) IMPLANT
GOWN STRL REUS W/ TWL LRG LVL3 (GOWN DISPOSABLE) ×1 IMPLANT
GOWN STRL REUS W/ TWL XL LVL3 (GOWN DISPOSABLE) ×2 IMPLANT
GOWN STRL REUS W/TWL LRG LVL3 (GOWN DISPOSABLE) ×2
GOWN STRL REUS W/TWL XL LVL3 (GOWN DISPOSABLE) ×4
KIT RM TURNOVER STRD PROC AR (KITS) ×3 IMPLANT
LABEL OR SOLS (LABEL) ×3 IMPLANT
LIQUID BAND (GAUZE/BANDAGES/DRESSINGS) ×3 IMPLANT
NEEDLE HYPO 25X1 1.5 SAFETY (NEEDLE) ×3 IMPLANT
NEEDLE SPNL 22GX3.5 QUINCKE BK (NEEDLE) IMPLANT
PACK BASIN MINOR ARMC (MISCELLANEOUS) ×3 IMPLANT
PACK UNIVERSAL (MISCELLANEOUS) IMPLANT
PROGRAMMER PATIENT (MISCELLANEOUS) ×3 IMPLANT
RECHARGER PATIENT (NEUROSTIMULATOR) ×3 IMPLANT
SOL PREP PVP 2OZ (MISCELLANEOUS)
SOLUTION PREP PVP 2OZ (MISCELLANEOUS) IMPLANT
STAPLER SKIN PROX 35W (STAPLE) ×3 IMPLANT
STIMULATOR SURESCAN SPINAL (Neurostimulator) ×3 IMPLANT
STRIP CLOSURE SKIN 1/2X4 (GAUZE/BANDAGES/DRESSINGS) ×2 IMPLANT
SUT SILK 0 SH 30 (SUTURE) IMPLANT
SUT SILK 2 0 SH (SUTURE) IMPLANT
SUT VIC AB 2-0 SH 27 (SUTURE) ×4
SUT VIC AB 2-0 SH 27XBRD (SUTURE) ×2 IMPLANT
SWABSTK COMLB BENZOIN TINCTURE (MISCELLANEOUS) ×3 IMPLANT
SYRINGE 10CC LL (SYRINGE) ×3 IMPLANT
WATER STERILE IRR 1000ML POUR (IV SOLUTION) IMPLANT

## 2016-06-11 NOTE — Op Note (Addendum)
Patient's Name: Joseph Ware  MRN: [id number]  Referring Provider: No ref. provider found  DOB: 09-Dec-1965  PCP: No PCP Per Patient  DOS: 05/05/2016  Note by: Kathlen Brunswick. Dossie Arbour, MD  Service setting: Ambulatory outpatient surgery  Location: [facility] (AMB) Pain Management Facility  Visit type: Surgery  Specialty: Interventional Pain Management  Patient type: Established   Operative Report  Preoperative Diagnosis: End of battery life  Procedure: Epidural Neurostimulator Battery replacement 1. Neurostimulator Generator Replacement (Battery change). 2. Intraoperative Analysis and Programming. 3. Postoperative Analysis and Programming. 4. Moderate Conscious Sedation by the American Anesthesia Team  Region: Gluteal Level: Lateral to PSIS Laterality: Right-Sided       Postoperative Diagnosis: Same  Anesthesia, Analgesia, Anxiolysis: Type: General Anesthesia Local Anesthetic: Lidocaine 1% Route: Intravenous (IV) IV Access: Secured by anesthesia team Sedation: General anesthesia  Indication(s): Analgesia & Anxiolysis  Surgeon: Damaris Abeln A. Dossie Arbour, M.D Side of implant: Right side Diagnostic Indications: Chronic Lumbosacral Radiculopathy/Radiculitis. End of battery life. Position: Prone.  Prepping solution: DuraPrep Area prepped: The Lumbosacral and buttocks areas, were prepped with a broad-spectrum topical antiseptic microbicide.  Indications: No diagnosis found.  Pain Score: Pre-procedure: 7 /10 Post-procedure: 2 /10  Pre-Procedure Assessment:  Joseph Ware is a 50 y.o. year old, male patient, seen today for interventional treatment. He has Spinal cord stimulator status; Battery end of life of spinal cord stimulator; Chronic pain; Encounter for pain management planning; Encounter for interrogation of neurostimulator; and Chronic pain of lower extremity on his problem list.. His primarily concern today is the No chief complaint on file.  Pain Type: Chronic pain Pain Location:  Back Pain Orientation: Lower Pain Descriptors / Indicators: Aching, Radiating, Shooting, Sharp Pain Onset: On-going  Coagulation Parameters Lab Results  Component Value Date   PLT 174 02/25/2016   Verification of the correct person, correct site (including marking of site), and correct procedure were performed and confirmed by the patient.  Consent: Secured. Under the influence of no sedatives a written informed consent was obtained, after having provided information on the risks and possible complications. To fulfill our ethical and legal obligations, as recommended by the American Medical Association's Code of Ethics, we have provided information to the patient about our clinical impression; the nature and purpose of the treatment or procedure; the risks, benefits, and possible complications of the intervention; alternatives; the risk(s) and benefit(s) of the alternative treatment(s) or procedure(s); and the risk(s) and benefit(s) of doing nothing. The patient was provided information about the risks and possible complications associated with the procedure. These include, but are not limited to, failure to achieve desired goals, infection, bleeding, organ or nerve damage, allergic reactions, paralysis, and death. In the case of spinal procedures these may include, but are not limited to, failure to achieve desired goals, infection, bleeding, organ or nerve damage, allergic reactions, paralysis, and death. In addition, the patient was informed that Medicine is not an exact science; therefore, there is also the possibility of unforeseen risks and possible complications that may result in a catastrophic outcome. The patient indicated having understood very clearly. We have given the patient no guarantees and we have made no promises. Enough time was given to the patient to ask questions, all of which were answered to the patient's satisfaction.  Consent Attestation: I, the ordering provider, attest  that I have discussed with the patient the benefits, risks, side-effects, alternatives, likelihood of achieving goals, and potential problems during recovery for the procedure that I have provided informed consent.  Pre-Procedure Preparation: Safety  Precautions: Allergies reviewed. Appropriate site, procedure, and patient were confirmed by following the Joint Commission's Universal Protocol (UP.01.01.01), in the form of a "Time Out". The patient was asked to confirm marked site and procedure, before commencing. The patient was asked about blood thinners, or active infections, both of which were denied. Patient was assessed for positional comfort and all pressure points were checked before starting procedure. Infection Control Precautions: Sterile technique used. Standard Universal Precautions were taken as recommended by the Department of [facility] for Disease Control and Prevention (CDC). Standard pre-surgical skin prep was conducted. Respiratory hygiene and cough etiquette was practiced. Hand hygiene observed. Safe injection practices and needle disposal techniques followed. SDV (single dose vial) medications used. Medications properly checked for expiration dates and contaminants. Personal protective equipment (PPE) used: Surgical mask, sterile gloves, gown, and protective head and shoe covers. (Standard O.R. PPE). Monitoring:  As per O.R. protocol. Vitals:   06/11/16 0850 06/11/16 0905 06/11/16 0911 06/11/16 0931  BP: 133/75 (!) 119/91 125/77 129/82  Pulse: (!) 57 (!) 56 (!) 57 (!) 56  Resp: 11 15 20 20   Temp:  97.2 F (36.2 C)    TempSrc:      SpO2: 95% 100% 100%   Weight:      Height:      Calculated BMI: Body mass index is 27.98 kg/m. Allergies: He has No Known Allergies.. Allergy Precautions: None required  Description of Procedure Process:   Time-out: "Time-out" completed before starting procedure, as per protocol. Position: Left Lateral Decubitus. Target Area: Implanted  battery Approach: Posterior approach. Area Prepped: Entire Posterior Lumbosacral and gluteal Region Prepping solution: ChloraPrep (2% chlorhexidine gluconate and 70% isopropyl alcohol) Safety Precautions: Safe injection practices and needle disposal techniques used. Medications properly checked for expiration dates. SDV (single dose vial) medications used. Latex Allergy precautions taken. Contrast Allergy precautions taken.   Description of the Procedure: Description of the procedure: The procedure site was prepped using a broad-spectrum topical antiseptic. The area was then draped in the usual and standard manner. "Time-out" was performed as per JC Universal Protocol (UP.01.01.01).   Once anesthesia was started, the area was infiltrated using local anesthetics. An incision was made and the old generator was located and identified. Care was taken not to damage any of the cables. The old generator was taken out and disconnected from the extension cables. At this point, the new generator was connected to the extensions and tested. An impedance check was conducted after connecting all sections of the system. After having confirmed proper working status, the new generator was placed back into the pocket. Once hemostasis was confirmed, both wounds were closed with Vicryl 2-0 after cleaning them with a solution containing 50:50 hydrogen peroxide and Betadine. Surgical glue were used to close the skin. The wounds were covered with sterile transparent bio-occlusive dressings, to easily assess any evidence of infection in the future.  The patient tolerated the entire procedure well. A repeat set of vitals were taken after the procedure and the patient was kept under observation until discharge criteria was met. The patient was provided with discharge instructions, including a section on how to identify potential problems. Should any problems arise concerning this procedure, the patient was given instructions to  immediately contact us, without hesitation. The neurostimulator representative and I, both provided the patient with our Business cards containing our contact telephone numbers, and instructed the patient to contact either one of [place], at any time, should there be any problems or questions. In any  case, we plan to contact the patient by telephone for a follow-up status report regarding this interventional procedure. EBL: Minimal Materials & Medications Used:  Solution Injected: 50:50 solution of 1% Lidocaine + 0.5% Bupivacaine (<20 mL)  Imaging Guidance:  Type of Imaging Technique: None Indication(s): N/A  Antibiotic Prophylaxis:  Indication(s): Implant Prophylaxis. Type:  Antibiotics Given (last 72 hours)    None     Post-operative Assessment:  Complications: No immediate post-treatment complications were observed. Disposition: The patient tolerated the entire procedure well. A repeat set of vitals were taken after the procedure and the patient was kept under observation following institutional policy, for this type of procedure. Post-procedural neurological assessment was performed, showing return to baseline, prior to discharge. The patient was provided with post-procedure discharge instructions, including a section on how to identify potential problems. Should any problems arise concerning this procedure, the patient was given instructions to immediately contact us, at any time, without hesitation. In any case, we plan to contact the patient by telephone for a follow-up status report regarding this interventional procedure. Discharge to: Discharge home Follow-up: 06/23/16 Comments:  Generator/Battery removed: Medtronic Model # [id number]. Generator/Battery implanted: Medtronic Model [id number] (Serial [id number]). Charging System: Model # [id number] (Serial No.: [id number]). Patient Programmer: Model No.: [id number] (Serial No.: [id number]). Generator removed had Serial No.: [id number].  Orders Placed This  Encounter  Procedures  . Provider attestation of informed consent for procedure/surgical case    I, the ordering provider, attest that I have discussed with the patient the benefits, risks, side effects, alternatives, likelihood of achieving goals and potential problems during recovery for the procedure that I have provided informed consent.    Standing Status:   Standing    Number of Occurrences:   1    Problem List Items Addressed This Visit    None    Visit Diagnoses   None.    Pharmacotherapy (Medications Ordered): Meds ordered this encounter  Medications  . ceFAZolin (ANCEF) IVPB 1 g/50 mL premix    Order Specific Question:   Antibiotic Indication:    Answer:   Other Indication (list below)    Order Specific Question:   Other Indication:    Answer:   Procedure Prophylaxis  . famotidine (PEPCID) 20 MG tablet    LEWIS, CINDY: cabinet override  . DISCONTD: scopolamine (TRANSDERM-SCOP) 1 MG/3DAYS    LEWIS, CINDY: cabinet override  . ceFAZolin (ANCEF) 1 GM/50ML IVPB    LEWIS, CINDY: cabinet override  . DISCONTD: lactated ringers infusion  . DISCONTD: famotidine (PEPCID) tablet 20 mg  . DISCONTD: scopolamine (TRANSDERM-SCOP) 1 MG/3DAYS 1.5 mg  . DISCONTD: fentaNYL (SUBLIMAZE) injection 25 mcg  . DISCONTD: ondansetron (ZOFRAN) injection 4 mg  . DISCONTD: 1% xylocaine with 0.5% sensorcaine and neut topical solution optime  . HYDROcodone-acetaminophen (NORCO/VICODIN) 5-325 MG tablet    Sig: Take 1 tablet by mouth every 6 (six) hours as needed for moderate pain. For post-operative pain    Dispense:  40 tablet    Refill:  0    Do not place this medication, or any other prescription from our practice, on "Automatic Refill". Patient may have prescription filled one day early if pharmacy is closed on scheduled refill date. Do not fill until: 06/11/16 To last until: 06/21/16  . cephALEXin (KEFLEX) 500 MG capsule    Sig: Take 1 capsule (500 mg total) by mouth 3 (three) times daily.     Dispense:  12 capsule    Refill:  0   Discharge Medication List  as of 06/11/2016  9:31 AM     Lab-work, Procedure(s), & Referral(s) Ordered: Orders Placed This Encounter  Procedures  . Provider attestation of informed consent for procedure/surgical case   Imaging & Referral(s) Ordered: PROVIDER ATTESTATION OF INFORMED CONSENT PROCEDURE/SURGICAL CASE  Future Appointments Date Time Provider [place]  06/21/2016 11:40 AM Milinda Pointer, MD [facility] None    Primary Care Physician: No PCP Per Patient Location: [facility] Note by: Kathlen Brunswick. Dossie Arbour, M.D, DABA, DABAPM, DABPM, DABIPP, FIPP  Disclaimer:  Medicine is not an exact science. The only guarantee in medicine is that nothing is guaranteed. It is important to note that the decision to proceed with this intervention was based on the information collected from the patient. The Data and conclusions were drawn from the patient's questionnaire, the interview, and the physical examination. Because the information was provided in large part by the patient, it cannot be guaranteed that it has not been purposely or unconsciously manipulated. Every effort has been made to obtain as much relevant data as possible for this evaluation. It is important to note that the conclusions that lead to this procedure are derived in large part from the available data. Always take into account that the treatment will also be dependent on availability of resources and existing treatment guidelines, considered by other Pain Management Practitioners as being common knowledge and practice, at the time of the intervention. For Medico-Legal purposes, it is also important to point out that variation in procedural techniques and pharmacological choices are the acceptable norm. The indications, contraindications, technique, and results of the above procedure should only be interpreted and judged by a Board-Certified Interventional  Pain Specialist with extensive familiarity and expertise in the same exact procedure and technique. Attempts at providing opinions without similar or greater experience and expertise than that of the treating physician will be considered as inappropriate and unethical, and shall result in a formal complaint to the state medical board and applicable specialty societies.

## 2016-06-11 NOTE — Transfer of Care (Signed)
Immediate Anesthesia Transfer of Care Note  Patient: Joseph Ware  Procedure(s) Performed: Procedure(s): SPINAL CORD STIMULATOR BATTERY EXCHANGE (Right)  Patient Location: PACU  Anesthesia Type:General  Level of Consciousness: awake, alert  and oriented  Airway & Oxygen Therapy: Patient Spontanous Breathing  Post-op Assessment: Post -op Vital signs reviewed and stable  Post vital signs: stable  Last Vitals:  Vitals:   06/11/16 0833 06/11/16 0835  BP: (!) 141/77 (!) 141/77  Pulse: 65 64  Resp: 14 19  Temp: 36.1 C     Last Pain:  Vitals:   06/11/16 0625  TempSrc: Oral  PainSc: 7          Complications: No apparent anesthesia complications

## 2016-06-11 NOTE — H&P (View-Only) (Signed)
Patient's Name: [name]  MRN: [id number]  Referring Provider: No ref. provider found  DOB: 19-Nov-1965  PCP: No PCP Per Patient  DOS: 05/31/2016  Note by: Sydnee LevansFrancisco A. Laban EmperorNaveira, MD  Service setting: Ambulatory outpatient  Specialty: Interventional Pain Management  Location: [facility] (AMB) Pain Management Facility    Patient type: Established   Primary Reason(s) for Visit: Encounter for evaluation before Surgery (Level of risk: moderate) CC: Back Pain (low)  HPI  Joseph Ware is a 50 y.o. year old, male patient, who returns today as an established patient. He has Spinal cord stimulator status; Battery end of life of spinal cord stimulator; Chronic pain; Encounter for pain management planning; Encounter for interrogation of neurostimulator; and Chronic pain of lower extremity on his problem list.. His primarily concern today is the Back Pain (low)  Pain Assessment: Self-Reported Pain Score: 6  Clinically the patient looks like a 1/10 Reported level is inconsistent with clinical observations. Information on the proper use of the pain score provided to the patient today. Pain Type: Chronic pain Pain Location: Back Pain Orientation: Lower Pain Descriptors / Indicators: Sharp Pain Frequency: Constant  The patient comes into the clinics today for post-procedure evaluation on the interventional treatment done on 04/07/2016. In addition, he comes in today for pharmacological management of his chronic pain.  The patient  reports that he does not use drugs. For years the patient has enjoyed good relief of his pain from the spinal cord stimulator. Unfortunately, the battery has ran out and he needs to come in for a replacement. He is generally healthy and therefore the risk of this procedure should not be significant, based on his general medical health. However, he has been informed of the usual risks and possible complications including but not limited to bleeding, infection, nerve damage, mechanical  problems with the device, or some type of unexpected, unpredictable problem.  Date of Last Visit: 04/07/16 Service Provided on Last Visit: Evaluation  Controlled Substance Pharmacotherapy Assessment & REMS (Risk Evaluation and Mitigation Strategy)  Analgesic: He is currently not taking any opioids.  Risk Assessment: Aberrant Behavior: None observed today Substance Use Disorder (SUD) Risk Level: Low Risk of opioid abuse or dependence: 0.7-3.0% with doses ? 36 MME/day and 6.1-26% with doses ? 120 MME/day.  Depression Scale Score: PHQ-2: PHQ-2 Total Score: 0 No depression (0) PHQ-9: PHQ-9 Total Score: 0 No depression (0-4)  Pharmacologic Plan: Today we may be taking over the patient's pharmacological regimen. See below  Previous Illicit Drug Screen Labs(s): No results found for: MDMA, COCAINSCRNUR, PCPSCRNUR, [facility]  Laboratory Chemistry  Inflammation Markers Lab Results  Component Value Date   ESRSEDRATE 2 02/25/2016   CRP <0.5 02/25/2016    Renal Function Lab Results  Component Value Date   BUN 18 02/25/2016   CREATININE 1.38 (H) 02/25/2016   GFRAA >60 02/25/2016   GFRNONAA 59 (L) 02/25/2016    Hepatic Function Lab Results  Component Value Date   AST 32 02/25/2016   ALT 36 02/25/2016   ALBUMIN 4.8 02/25/2016    Electrolytes Lab Results  Component Value Date   NA 138 02/25/2016   K 4.3 02/25/2016   CL 107 02/25/2016   CALCIUM 9.3 02/25/2016   MG 2.1 02/25/2016    Pain Modulating Vitamins Lab Results  Component Value Date   25OHVITD1 34 02/25/2016   25OHVITD2 <1.0 02/25/2016   25OHVITD3 34 02/25/2016   VITAMINB12 235 02/25/2016    Coagulation Parameters Lab Results  Component Value Date   PLT  174 02/25/2016    Cardiovascular Lab Results  Component Value Date   HGB 15.4 02/25/2016   HCT 45.4 02/25/2016   Note: Lab results reviewed.  Recent Diagnostic Imaging  Thoracic Imaging: Thoracic DG 2-3 views:  Results for orders placed during the  hospital encounter of 02/25/16  DG Thoracic Spine 2 View   Narrative CLINICAL DATA:  Mid and lower back pain radiating to the left leg  EXAM: THORACIC SPINE 2 VIEWS  COMPARISON:  None.  FINDINGS: There is no evidence of thoracic spine fracture. Alignment is normal. There are minimal degenerative joint changes of the lower thoracic spine with anterior osteophytosis. Spinal stimulator leads are identified.  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   Electronically Signed   By: Sherian Rein M.D.   On: 02/25/2016 16:00    Lumbosacral Imaging: Lumbar DG (Complete) 4+V:  Results for orders placed during the hospital encounter of 02/25/16  DG Lumbar Spine Complete   Narrative CLINICAL DATA:  Mid and lower back pain extending to the left leg.  EXAM: LUMBAR SPINE - COMPLETE 4+ VIEW  COMPARISON:  None.  FINDINGS: There is no evidence of lumbar spine fracture. Alignment is normal. Spinal stimulator leads are identified. There are minimal degenerative joint changes at L2-L3 and L4 with minimal anterior osteophytosis. There are degenerative joint changes of L5-S1 with narrowed joint space and osteophyte formation.  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   Electronically Signed   By: Sherian Rein M.D.   On: 02/25/2016 16:01    Note: Imaging results reviewed.  Meds  The patient has a current medication list which includes the following prescription(s): albuterol sulfate, ephedrine-guaifenesin, ibuprofen, and ranitidine.  Current Outpatient Prescriptions on File Prior to Visit  Medication Sig  . ALBUTEROL SULFATE HFA IN Inhale into the lungs.  Marland Kitchen ePHEDrine-GuaiFENesin (PRIMATENE ASTHMA PO) Take by mouth as needed.  Marland Kitchen ibuprofen (ADVIL,MOTRIN) 200 MG tablet Take 200 mg by mouth every 6 (six) hours as needed.  . ranitidine (ZANTAC) 150 MG tablet Take 150 mg by mouth as needed for  heartburn.   No current facility-administered medications on file prior to visit.     ROS  Constitutional: Denies any fever or chills Gastrointestinal: No reported hemesis, hematochezia, vomiting, or acute GI distress Musculoskeletal: Denies any acute onset joint swelling, redness, loss of ROM, or weakness Neurological: No reported episodes of acute onset apraxia, aphasia, dysarthria, agnosia, amnesia, paralysis, loss of coordination, or loss of consciousness  Allergies  Joseph Ware has No Known Allergies.  PFSH  Medical:  Joseph Ware  has a past medical history of Asthma; History of hiatal hernia; and PONV (postoperative nausea and vomiting). Family: family history is not on file. Surgical:  has a past surgical history that includes Shoulder surgery (Right); Back surgery; Knee arthroscopy (Bilateral); and Appendectomy. Tobacco:  reports that he has never smoked. He has never used smokeless tobacco. Alcohol:  reports that he drinks about 1.8 oz of alcohol per week . Drug:  reports that he does not use drugs.  Constitutional Exam  General appearance: Well nourished, well developed, and well hydrated. In no acute distress Vitals:   05/31/16 0842  BP: (!) 136/92  Pulse: (!) 56  Resp: 16  Temp: 98.3 F (36.8 C)  SpO2: 99%  Weight: 195 lb (88.5 kg)  Height: 5\' 10"  (1.778 m)  BMI Assessment: Estimated body mass index is 27.98 kg/m as calculated from the following:  Height as of this encounter: 5\' 10"  (1.778 m).   Weight as of this encounter: 195 lb (88.5 kg).   BMI interpretation:           BMI Readings from Last 4 Encounters:  05/31/16 27.98 kg/m  05/31/16 27.98 kg/m  04/07/16 28.70 kg/m  02/04/16 28.27 kg/m   Wt Readings from Last 4 Encounters:  05/31/16 195 lb (88.5 kg)  05/31/16 195 lb (88.5 kg)  04/07/16 200 lb (90.7 kg)  02/04/16 197 lb (89.4 kg)  Psych/Mental status: Alert and oriented x 3 (person, place, & time) Eyes: PERLA Respiratory: No evidence of acute  respiratory distress  Cervical Spine Exam  Inspection: No masses, redness, or swelling Alignment: Symmetrical Functional ROM: ROM appears unrestricted Stability: No instability detected Muscle strength & Tone: Functionally intact Sensory: Unimpaired Palpation: Non-contributory  Upper Extremity (UE) Exam    Side: Right upper extremity  Side: Left upper extremity  Inspection: No masses, redness, swelling, or asymmetry  Inspection: No masses, redness, swelling, or asymmetry  Functional ROM: ROM appears unrestricted          Functional ROM: ROM appears unrestricted          Muscle strength & Tone: Functionally intact  Muscle strength & Tone: Functionally intact  Sensory: Unimpaired  Sensory: Unimpaired  Palpation: Non-contributory  Palpation: Non-contributory   Thoracic Spine Exam  Inspection: No masses, redness, or swelling Alignment: Symmetrical Functional ROM: ROM appears unrestricted Stability: No instability detected Sensory: Unimpaired Muscle strength & Tone: Functionally intact Palpation: Non-contributory  Lumbar Spine Exam  Inspection: No masses, redness, or swelling Alignment: Symmetrical Functional ROM: ROM appears unrestricted Stability: No instability detected Muscle strength & Tone: Functionally intact Sensory: Unimpaired Palpation: Non-contributory Provocative Tests: Lumbar Hyperextension and rotation test: evaluation deferred today       Patrick's Maneuver: evaluation deferred today              Gait & Posture Assessment  Ambulation: Unassisted Gait: Relatively normal for age and body habitus Posture: WNL   Lower Extremity Exam    Side: Right lower extremity  Side: Left lower extremity  Inspection: No masses, redness, swelling, or asymmetry  Inspection: No masses, redness, swelling, or asymmetry  Functional ROM: ROM appears unrestricted          Functional ROM: ROM appears unrestricted          Muscle strength & Tone: Functionally intact  Muscle strength &  Tone: Functionally intact  Sensory: Unimpaired  Sensory: Unimpaired  Palpation: Non-contributory  Palpation: Non-contributory    Assessment & Plan  Primary Diagnosis & Pertinent Problem List: The primary encounter diagnosis was Battery end of life of spinal cord stimulator. Diagnoses of Chronic pain, Spinal cord stimulator status, Encounter for interrogation of neurostimulator, and Chronic pain of lower extremity, unspecified laterality were also pertinent to this visit.  Visit Diagnosis: 1. Battery end of life of spinal cord stimulator   2. Chronic pain   3. Spinal cord stimulator status   4. Encounter for interrogation of neurostimulator   5. Chronic pain of lower extremity, unspecified laterality     Problems updated and reviewed during this visit: Problem  Encounter for Interrogation of Neurostimulator  Chronic Pain of Lower Extremity    Problem-specific Plan(s): No problem-specific Assessment & Plan notes found for this encounter.  No new Assessment & Plan notes have been filed under this hospital service since the last note was generated. Service: Pain Management   Plan of Care   Problem List Items  Addressed This Visit      High   Battery end of life of spinal cord stimulator - Primary   Chronic pain (Chronic)   Chronic pain of lower extremity (Chronic)   Encounter for interrogation of neurostimulator   Spinal cord stimulator status (Chronic)    Other Visit Diagnoses   None.      Pharmacotherapy (Medications Ordered): No orders of the defined types were placed in this encounter.   Lab-work & Procedure Ordered: No orders of the defined types were placed in this encounter.   Imaging Ordered: OT NEUROSTIMULATOR  Interventional Therapies: Scheduled:  Revision and replacement of neurostimulator battery.    Considering:  None at this time.    PRN Procedures:  None at this time.    Referral(s) or Consult(s): None at this time.  New Prescriptions   No  medications on file    Medications administered during this visit: Joseph Ware had no medications administered during this visit.  Requested PM Follow-up: Return in 3 weeks (on 06/21/2016) for Post-Procedure evaluation.  Future Appointments Date Time Provider Department Center  05/31/2016 11:40 AM Delano Metz, MD [facility] None  06/21/2016 11:40 AM Delano Metz, MD [facility] None    Primary Care Physician: No PCP Per Patient Location: [facility] Note by: Sydnee Levans. Laban Emperor, M.D, DABA, DABAPM, DABPM, DABIPP, FIPP  Pain Score Disclaimer: We use the NRS-11 scale. This is a self-reported, subjective measurement of pain severity with only modest accuracy. It is used primarily to identify changes within a particular patient. It must be understood that outpatient pain scales are significantly less accurate that those used for research, where they can be applied under ideal controlled circumstances with minimal exposure to variables. In reality, the score is likely to be a combination of pain intensity and pain affect, where pain affect describes the degree of emotional arousal or changes in action readiness caused by the sensory experience of pain. Factors such as social and work situation, setting, emotional state, anxiety levels, expectation, and prior pain experience may influence pain perception and show large inter-individual differences that may also be affected by time variables.  Patient instructions provided during this appointment: There are no Patient Instructions on file for this visit.

## 2016-06-11 NOTE — Discharge Instructions (Signed)
AMBULATORY SURGERY  °DISCHARGE INSTRUCTIONS ° ° °1) The drugs that you were given will stay in your system until tomorrow so for the next 24 hours you should not: ° °A) Drive an automobile °B) Make any legal decisions °C) Drink any alcoholic beverage ° ° °2) You may resume regular meals tomorrow.  Today it is better to start with liquids and gradually work up to solid foods. ° °You may eat anything you prefer, but it is better to start with liquids, then soup and crackers, and gradually work up to solid foods. ° ° °3) Please notify your doctor immediately if you have any unusual bleeding, trouble breathing, redness and pain at the surgery site, drainage, fever, or pain not relieved by medication. ° ° ° °4) Additional Instructions: ° ° ° ° ° ° ° °Please contact your physician with any problems or Same Day Surgery at [phone], Monday through Friday 6 am to 4 pm, or [place] [facility] [place] Main number at [phone]. °

## 2016-06-11 NOTE — Brief Op Note (Signed)
06/11/2016  8:52 AM  PATIENT:  Joseph Ware  50 y.o. male  PRE-OPERATIVE DIAGNOSIS:  SHOULDER,BACK,LEG PAIN  POST-OPERATIVE DIAGNOSIS:  end of battery life of spinal cord stimulator  PROCEDURE:  Procedure(s): SPINAL CORD STIMULATOR BATTERY EXCHANGE (Right)  SURGEON:  Surgeon(s) and Role:    * Delano MetzFrancisco Lakelyn Straus, MD - Primary  PHYSICIAN ASSISTANT:   ASSISTANTS: none   ANESTHESIA:   general  EBL:  No intake/output data recorded.  BLOOD ADMINISTERED:none  DRAINS: none   LOCAL MEDICATIONS USED:  BUPIVICAINE  and XYLOCAINE   SPECIMEN:  Source of Specimen:  Depleted Agricultural engineerBattery/Generator Model # [id number]  DISPOSITION OF SPECIMEN:  PATHOLOGY  COUNTS:  YES   TOURNIQUET:  * No tourniquets in log *  DICTATION: .Note written in EPIC  PLAN OF CARE: Discharge to home after PACU  PATIENT DISPOSITION:  PACU - hemodynamically stable.   Delay start of Pharmacological VTE agent (>24hrs) due to surgical blood loss or risk of bleeding: not applicable

## 2016-06-11 NOTE — Interval H&P Note (Signed)
History and Physical Interval Note:  06/11/2016 7:03 AM  Joseph Ware  has presented today for surgery, with the diagnosis of SHOULDER,BACK,LEG PAIN  The various methods of treatment have been discussed with the patient and family. After consideration of risks, benefits and other options for treatment, the patient has consented to  Procedure(s): SPINAL CORD STIMULATOR BATTERY EXCHANGE (N/A) as a surgical intervention .  The patient's history has been reviewed, patient examined, no change in status, stable for surgery.  I have reviewed the patient's chart and labs.  Questions were answered to the patient's satisfaction.     Domingo Fuson A Alee Katen

## 2016-06-11 NOTE — Anesthesia Postprocedure Evaluation (Signed)
Anesthesia Post Note  Patient: Joseph Ware  Procedure(s) Performed: Procedure(s) (LRB): SPINAL CORD STIMULATOR BATTERY EXCHANGE (Right)  Patient location during evaluation: PACU Anesthesia Type: General Level of consciousness: awake and alert Pain management: pain level controlled Vital Signs Assessment: post-procedure vital signs reviewed and stable Respiratory status: spontaneous breathing and respiratory function stable Cardiovascular status: stable Anesthetic complications: no    Last Vitals:  Vitals:   06/11/16 0911 06/11/16 0931  BP: 125/77 129/82  Pulse: (!) 57 (!) 56  Resp: 20 20  Temp:      Last Pain:  Vitals:   06/11/16 0905  TempSrc:   PainSc: 0-No pain                 [name]

## 2016-06-11 NOTE — Anesthesia Preprocedure Evaluation (Signed)
Anesthesia Evaluation  Patient identified by MRN, date of birth, ID band Patient awake    Reviewed: Allergy & Precautions, NPO status , Patient's Chart, lab work & pertinent test results  History of Anesthesia Complications (+) PONV  Airway Mallampati: II       Dental   Pulmonary asthma ,           Cardiovascular negative cardio ROS       Neuro/Psych negative neurological ROS     GI/Hepatic hiatal hernia, GERD  Medicated and Controlled,  Endo/Other    Renal/GU      Musculoskeletal   Abdominal   Peds  Hematology   Anesthesia Other Findings   Reproductive/Obstetrics                            Anesthesia Physical Anesthesia Plan  ASA: II  Anesthesia Plan: General   Post-op Pain Management:    Induction: Intravenous  Airway Management Planned: Nasal Cannula  Additional Equipment:   Intra-op Plan:   Post-operative Plan:   Informed Consent: I have reviewed the patients History and Physical, chart, labs and discussed the procedure including the risks, benefits and alternatives for the proposed anesthesia with the patient or authorized representative who has indicated his/her understanding and acceptance.     Plan Discussed with:   Anesthesia Plan Comments:         Anesthesia Quick Evaluation

## 2016-06-14 LAB — SURGICAL PATHOLOGY

## 2016-06-21 ENCOUNTER — Ambulatory Visit: Payer: Worker's Compensation | Attending: Pain Medicine | Admitting: Pain Medicine

## 2016-06-21 ENCOUNTER — Encounter: Payer: Self-pay | Admitting: Pain Medicine

## 2016-06-21 VITALS — BP 127/77 | HR 63 | Temp 98.1°F | Ht 70.0 in | Wt 195.0 lb

## 2016-06-21 DIAGNOSIS — M79605 Pain in left leg: Secondary | ICD-10-CM | POA: Insufficient documentation

## 2016-06-21 DIAGNOSIS — M545 Low back pain: Secondary | ICD-10-CM | POA: Diagnosis not present

## 2016-06-21 DIAGNOSIS — Z9689 Presence of other specified functional implants: Secondary | ICD-10-CM | POA: Diagnosis not present

## 2016-06-21 DIAGNOSIS — J45909 Unspecified asthma, uncomplicated: Secondary | ICD-10-CM | POA: Insufficient documentation

## 2016-06-21 NOTE — Progress Notes (Signed)
Pt not in room when MD came in room- Secretary states she saw him walking out the front door

## 2016-06-23 NOTE — Progress Notes (Signed)
Patient's Name: Joseph Ware  MRN: [id number]  Referring Provider: No ref. provider found  DOB: [birth year]  PCP: No PCP Per Patient  DOS: 06/21/2016  Note by: Kathlen Brunswick. Dossie Arbour, MD  Service setting: Ambulatory outpatient  Specialty: Interventional Pain Management  Location: [facility] (AMB) Pain Management Facility    Patient type: Established   Primary Reason(s) for Visit: Encounter for post-procedure evaluation of chronic illness with mild to moderate exacerbation CC: Back Pain (low back ) and Leg Pain (left leg to foot )  HPI  Joseph Ware is a 50 y.o. year old, male patient, who comes today for an initial evaluation. He has Spinal cord stimulator status; Battery end of life of spinal cord stimulator; Chronic pain; Encounter for pain management planning; Encounter for interrogation of neurostimulator; and Chronic pain of lower extremity on his problem list.. His primarily concern today is the Back Pain (low back ) and Leg Pain (left leg to foot )  Pain Assessment: Self-Reported Pain Score: 6 /10             Pain Type: Chronic pain Pain Location: Back Pain Orientation: Lower Pain Descriptors / Indicators: Constant, Aching, Burning, Sharp  The patient comes into the clinics today for post-procedure evaluation on the interventional treatment done on 05/31/2016. Joseph Ware was met today by the [employer] representative who went over the use of the device. X-rays of the thoracic spine showed the electrodes to be located over the T10 vertebra body. He has two, 4 electrode leads on each side of an Octrode (8 electrode lead) with the top electrode seen over the midline just over the shadow of the T9 lower endplate and with the lower most electrode over the T10-11 intravertebral disc area, still in the midline. The patient was given a high frequency programming to try with his new battery. Once he had his electrode programmed, he left the clinic without waiting for me to come in to complete my evaluation.  He did not leave noticed to anybody and simply left without getting a return appointment. Review of his records show that he has no PCP, which is a requirement for [place] to continue seeing patients here. At this point, we will remain available to him for the next 30 days after which we recommend that he find another physician for his next battery replacement and/or any further surgical adjustments to his device as I will no longer be doing surgical implants.  Date of Last Visit: 06/11/16   Post-Procedure Assessment  Procedure done on 05/31/2016: Spinal cord stimulator battery replacement. Complications experienced at the time of the procedure: None Side-effects or Adverse reactions: None reported  Laboratory Chemistry  Inflammation Markers Lab Results  Component Value Date   ESRSEDRATE 2 02/25/2016   CRP <0.5 02/25/2016   Renal Function Lab Results  Component Value Date   BUN 18 02/25/2016   CREATININE 1.38 (H) 02/25/2016   GFRAA >60 02/25/2016   GFRNONAA 59 (L) 02/25/2016   Hepatic Function Lab Results  Component Value Date   AST 32 02/25/2016   ALT 36 02/25/2016   ALBUMIN 4.8 02/25/2016   Electrolytes Lab Results  Component Value Date   NA 138 02/25/2016   K 4.3 02/25/2016   CL 107 02/25/2016   CALCIUM 9.3 02/25/2016   MG 2.1 02/25/2016   Pain Modulating Vitamins Lab Results  Component Value Date   25OHVITD1 34 02/25/2016   25OHVITD2 <1.0 02/25/2016   25OHVITD3 34 02/25/2016   VITAMINB12 235 02/25/2016   Coagulation Parameters  Lab Results  Component Value Date   PLT 174 02/25/2016   Cardiovascular Lab Results  Component Value Date   HGB 15.4 02/25/2016   HCT 45.4 02/25/2016   Note: Lab results reviewed.  Recent Diagnostic Imaging  No results found. Meds  The patient has a current medication list which includes the following prescription(s): albuterol sulfate, ibuprofen, and ranitidine.  Current Outpatient Prescriptions on File Prior to Visit   Medication Sig  . ALBUTEROL SULFATE HFA IN Inhale into the lungs 1 day or 1 dose.   . ibuprofen (ADVIL,MOTRIN) 200 MG tablet Take 200 mg by mouth every 6 (six) hours as needed.  . ranitidine (ZANTAC) 150 MG tablet Take 150 mg by mouth as needed for heartburn.   No current facility-administered medications on file prior to visit.    ROS  Constitutional: Denies any fever or chills Gastrointestinal: No reported hemesis, hematochezia, vomiting, or acute GI distress Musculoskeletal: Denies any acute onset joint swelling, redness, loss of ROM, or weakness Neurological: No reported episodes of acute onset apraxia, aphasia, dysarthria, agnosia, amnesia, paralysis, loss of coordination, or loss of consciousness  Allergies  Joseph Ware has No Known Allergies.  [place]  Medical:  Joseph Ware  has a past medical history of Asthma; History of hiatal hernia; and PONV (postoperative nausea and vomiting). Family: family history includes Hearing loss in his mother. Surgical:  has a past surgical history that includes Shoulder surgery (Right); Back surgery; Knee arthroscopy (Bilateral); Appendectomy; and Spinal cord stimulator battery exchange (Right, 06/11/2016). Tobacco:  reports that he has never smoked. He has never used smokeless tobacco. Alcohol:  reports that he drinks about 1.8 oz of alcohol per week . Drug:  reports that he does not use drugs.  Constitutional Exam   Vitals:   06/21/16 1257  BP: 127/77  Pulse: 63  Temp: 98.1 F (36.7 C)  TempSrc: Oral  Weight: 195 lb (88.5 kg)  Height: 5' 10"  (1.778 m)  BMI Assessment: Estimated body mass index is 27.98 kg/m as calculated from the following:   Height as of this encounter: 5' 10"  (1.778 m).   Weight as of this encounter: 195 lb (88.5 kg).   BMI interpretation: (25-29.9 kg/m2) = Overweight: This range is associated with a 20% higher incidence of chronic pain. BMI Readings from Last 4 Encounters:  06/21/16 27.98 kg/m  06/11/16 27.98  kg/m  05/31/16 27.98 kg/m  05/31/16 27.98 kg/m   Wt Readings from Last 4 Encounters:  06/21/16 195 lb (88.5 kg)  06/11/16 195 lb (88.5 kg)  05/31/16 195 lb (88.5 kg)  05/31/16 195 lb (88.5 kg)   Assessment & Plan  Primary Diagnosis & Pertinent Problem List: The encounter diagnosis was Spinal cord stimulator status.  Visit Diagnosis: 1. 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    Pharmacotherapy (Medications Ordered): No orders of the defined types were placed in this encounter.  New Prescriptions   No medications on file   Medications administered during this visit: Joseph Ware had no medications administered during this visit. Lab-work, Procedure(s), & Referral(s) Ordered: No orders of the defined types were placed in this encounter.  Imaging & Referral(s) Ordered: None  Interventional Therapies: No further interventions. We will remain available for the next 30 days after which the patient will no longer be under  our care.    Requested PM Follow-up: No Follow-up on file.  No future appointments.  Primary Care Physician: No PCP Per Patient Location: [facility] Note by: Elana Jian A. Dossie Arbour, M.D, DABA, DABAPM, DABPM, DABIPP, FIPP  Pain Score Disclaimer: We use the NRS-11 scale. This is a self-reported, subjective measurement of pain severity with only modest accuracy. It is used primarily to identify changes within a particular patient. It must be understood that outpatient pain scales are significantly less accurate that those used for research, where they can be applied under ideal controlled circumstances with minimal exposure to variables. In reality, the score is likely to be a combination of pain intensity and pain affect, where pain affect describes the degree of emotional arousal or  changes in action readiness caused by the sensory experience of pain. Factors such as social and work situation, setting, emotional state, anxiety levels, expectation, and prior pain experience may influence pain perception and show large inter-individual differences that may also be affected by time variables.  Patient instructions provided during this appointment: There are no Patient Instructions on file for this visit.

## 2016-07-09 IMAGING — CR DG THORACIC SPINE 2V
4 series · 4 of 4 positions shown · non-contrast
Comparison: None.

CLINICAL DATA: Mid and lower back pain radiating to the left leg

EXAM:
THORACIC SPINE 2 VIEWS

[t-spine ap (1 of 2)]
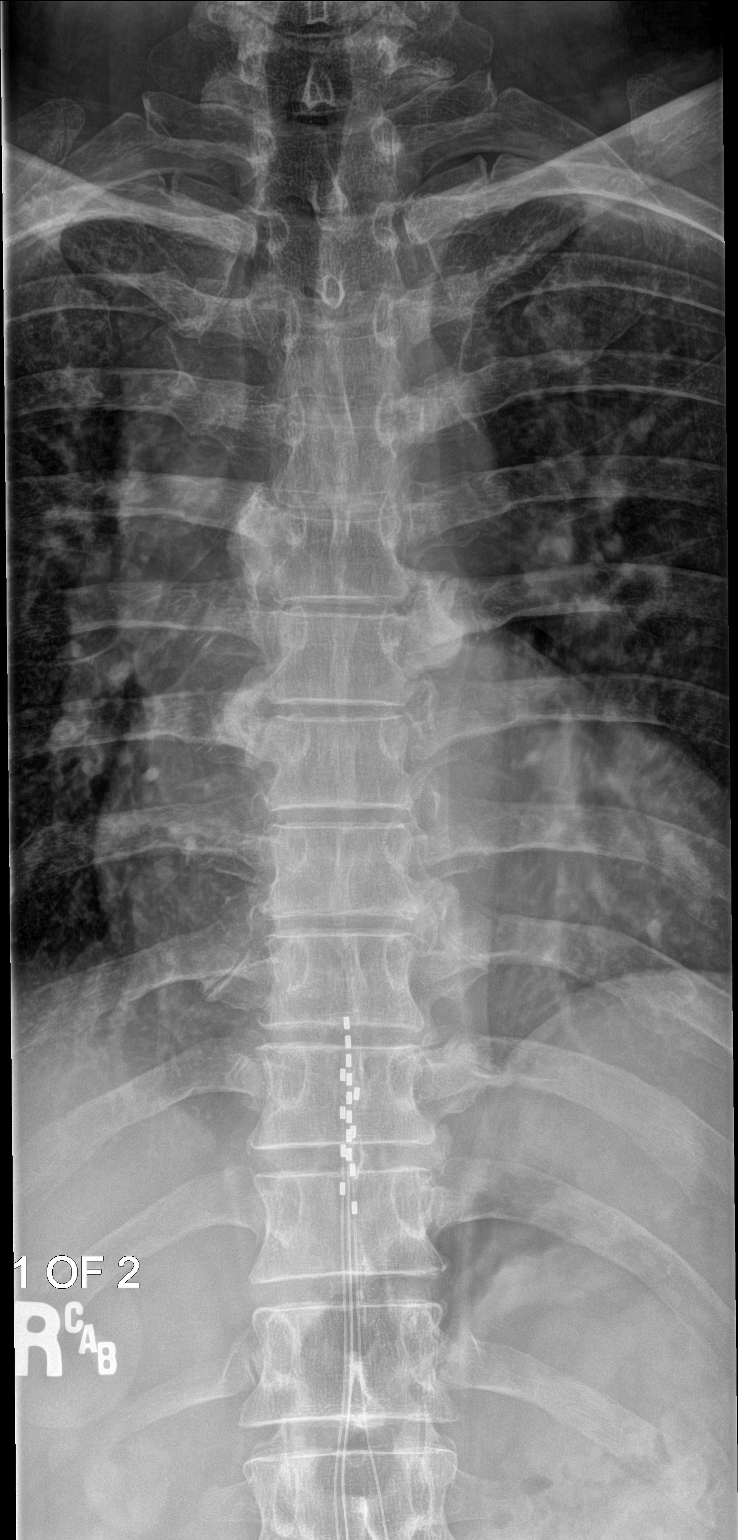

[t-spine lat]
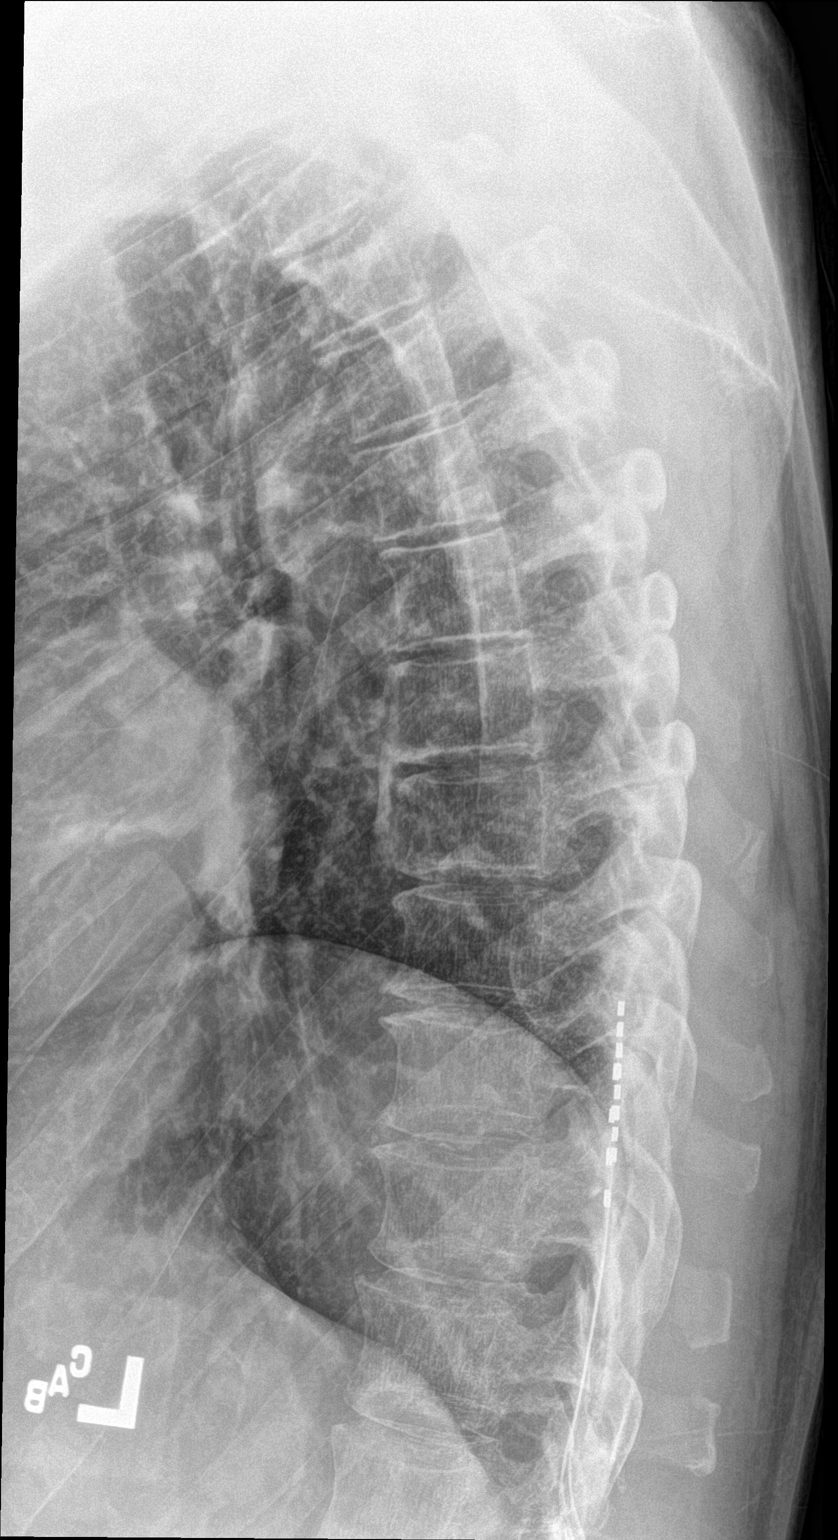

[t-spine swimmers]
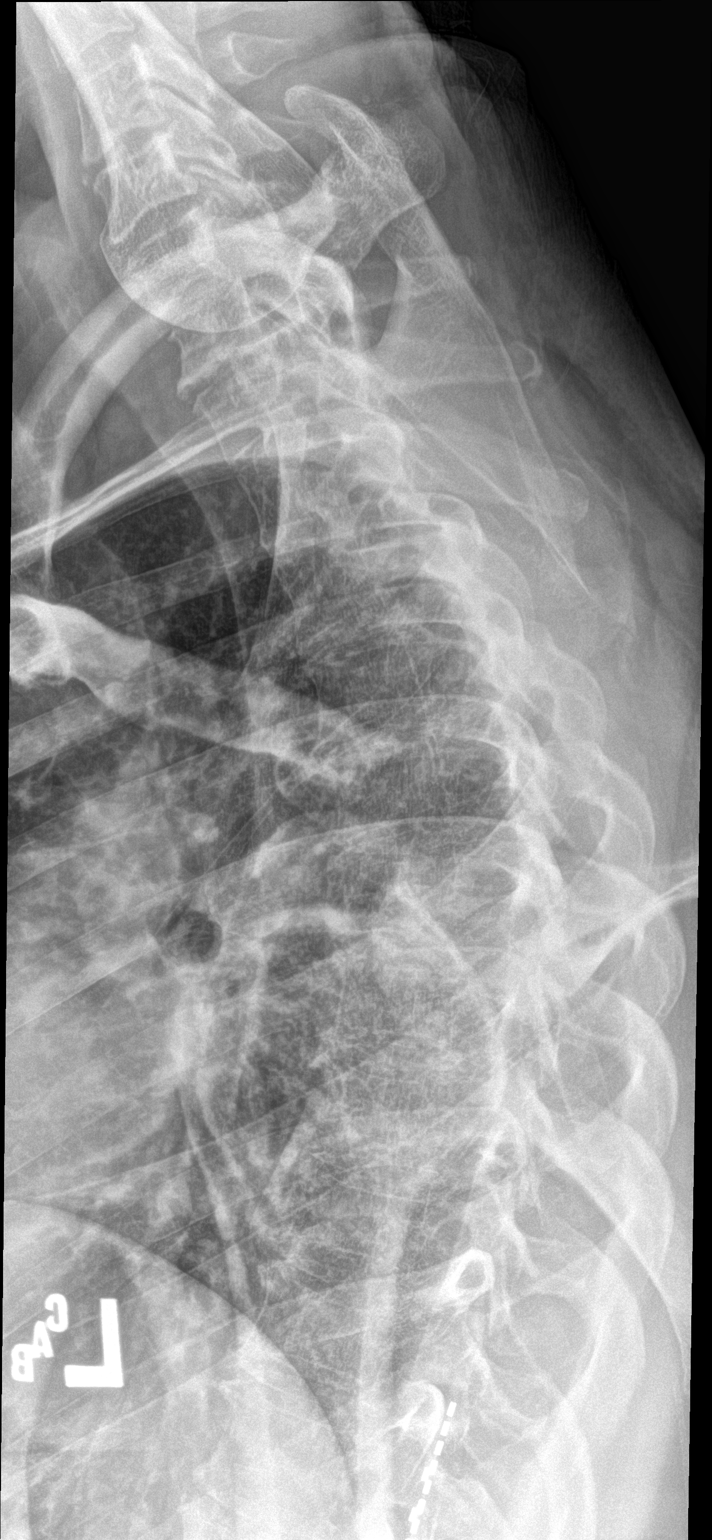

[t-spine ap (2 of 2)]
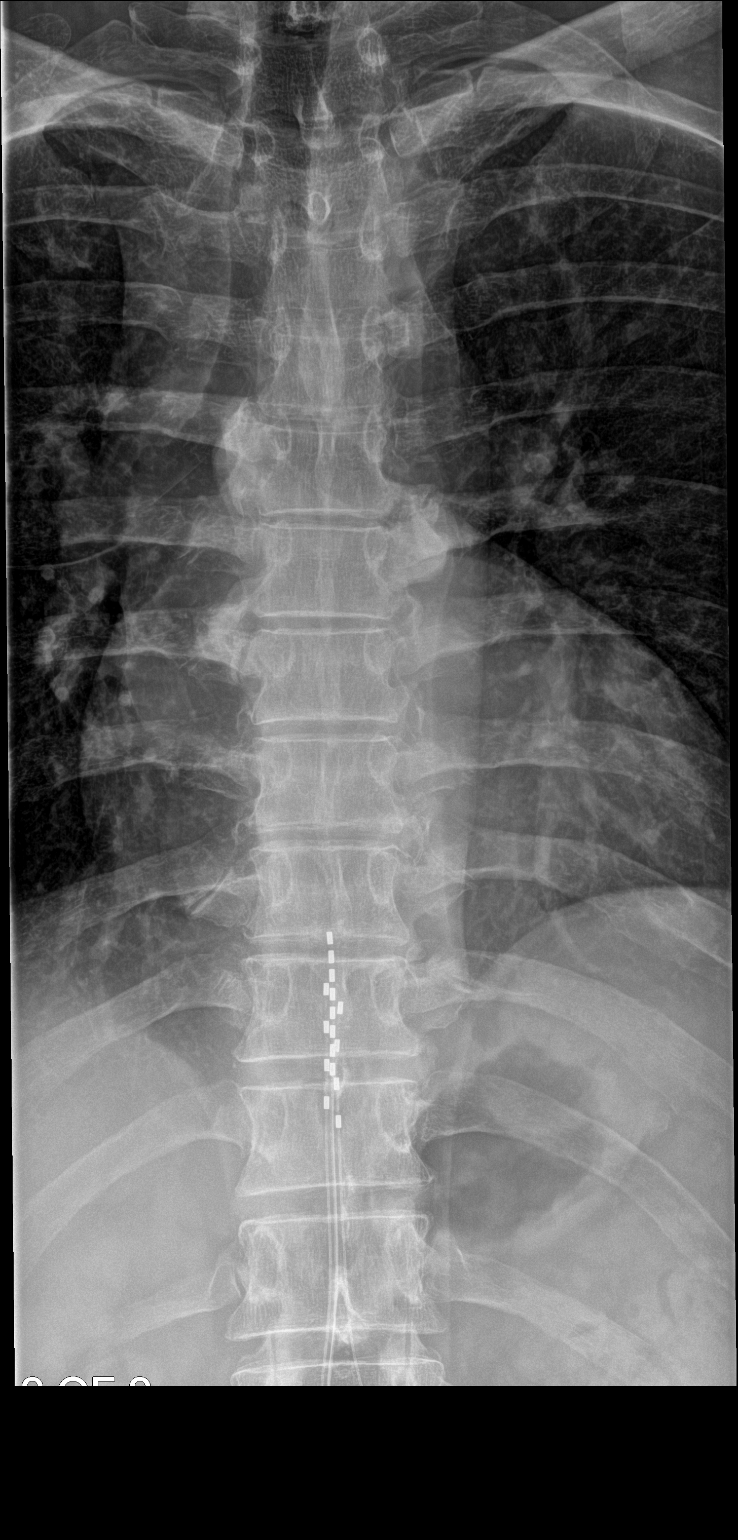

[4 of 4 positions shown; findings below may reference images not displayed]

FINDINGS: There is no evidence of thoracic spine fracture. Alignment is
normal. There are minimal degenerative joint changes of the lower
thoracic spine with anterior osteophytosis. Spinal stimulator leads
are identified.
IMPRESSION: Minimal degenerative joint changes of the thoracic spine.

## 2021-06-17 ENCOUNTER — Telehealth: Payer: Self-pay | Admitting: Pain Medicine

## 2021-06-17 NOTE — Telephone Encounter (Signed)
Attempted to call patient.  No answer.  Please call him and let him know that he needs to call his [employer] rep to troubleshoot why the SCS will  not turn on.  Thanks

## 2021-06-17 NOTE — Telephone Encounter (Signed)
Having problems with SCS. Cant get to turn on. Error msg says call phys office.  Last msg from Dr. Laban Emperor says ask him before scheduling this patient. He has [employer] SCS. Please call patient and advise.
